# Patient Record
Sex: Female | Born: 1970 | Race: White | Hispanic: No | Smoking: Former smoker
Health system: Southern US, Community
[De-identification: ages and names within clinical notes are randomized; demographics above are authoritative.]

## PROBLEM LIST (undated history)

## (undated) DIAGNOSIS — Z8659 Personal history of other mental and behavioral disorders: Secondary | ICD-10-CM

## (undated) DIAGNOSIS — D649 Anemia, unspecified: Secondary | ICD-10-CM

## (undated) DIAGNOSIS — F329 Major depressive disorder, single episode, unspecified: Secondary | ICD-10-CM

## (undated) DIAGNOSIS — F419 Anxiety disorder, unspecified: Secondary | ICD-10-CM

## (undated) DIAGNOSIS — R011 Cardiac murmur, unspecified: Secondary | ICD-10-CM

## (undated) DIAGNOSIS — F32A Depression, unspecified: Secondary | ICD-10-CM

## (undated) DIAGNOSIS — N912 Amenorrhea, unspecified: Secondary | ICD-10-CM

## (undated) DIAGNOSIS — K900 Celiac disease: Secondary | ICD-10-CM

## (undated) DIAGNOSIS — R32 Unspecified urinary incontinence: Secondary | ICD-10-CM

## (undated) HISTORY — DX: Amenorrhea, unspecified: N91.2

## (undated) HISTORY — DX: Personal history of other mental and behavioral disorders: Z86.59

## (undated) HISTORY — DX: Depression, unspecified: F32.A

## (undated) HISTORY — DX: Anemia, unspecified: D64.9

## (undated) HISTORY — PX: OVARIAN CYST DRAINAGE: SHX325

## (undated) HISTORY — DX: Celiac disease: K90.0

## (undated) HISTORY — DX: Cardiac murmur, unspecified: R01.1

## (undated) HISTORY — DX: Anxiety disorder, unspecified: F41.9

## (undated) HISTORY — DX: Major depressive disorder, single episode, unspecified: F32.9

## (undated) HISTORY — DX: Unspecified urinary incontinence: R32

## (undated) HISTORY — PX: COLONOSCOPY: SHX174

---

## 2006-07-13 DIAGNOSIS — K900 Celiac disease: Secondary | ICD-10-CM | POA: Insufficient documentation

## 2014-12-13 DIAGNOSIS — Z8659 Personal history of other mental and behavioral disorders: Secondary | ICD-10-CM | POA: Insufficient documentation

## 2014-12-13 DIAGNOSIS — Z8739 Personal history of other diseases of the musculoskeletal system and connective tissue: Secondary | ICD-10-CM | POA: Insufficient documentation

## 2015-10-31 ENCOUNTER — Encounter: Payer: Self-pay | Admitting: Obstetrics and Gynecology

## 2015-11-16 ENCOUNTER — Encounter: Payer: Self-pay | Admitting: Obstetrics and Gynecology

## 2015-11-16 ENCOUNTER — Ambulatory Visit: Payer: Self-pay | Admitting: Obstetrics and Gynecology

## 2015-11-16 VITALS — BP 124/72 | HR 60 | Resp 13 | Ht 63.25 in | Wt 117.0 lb

## 2015-11-16 DIAGNOSIS — Z8742 Personal history of other diseases of the female genital tract: Secondary | ICD-10-CM

## 2015-11-16 DIAGNOSIS — R1031 Right lower quadrant pain: Secondary | ICD-10-CM | POA: Diagnosis not present

## 2015-11-16 DIAGNOSIS — Z124 Encounter for screening for malignant neoplasm of cervix: Secondary | ICD-10-CM

## 2015-11-16 DIAGNOSIS — Z30431 Encounter for routine checking of intrauterine contraceptive device: Secondary | ICD-10-CM | POA: Diagnosis not present

## 2015-11-16 DIAGNOSIS — Z3009 Encounter for other general counseling and advice on contraception: Secondary | ICD-10-CM | POA: Diagnosis not present

## 2015-11-16 DIAGNOSIS — Z01419 Encounter for gynecological examination (general) (routine) without abnormal findings: Secondary | ICD-10-CM

## 2015-11-16 DIAGNOSIS — R102 Pelvic and perineal pain: Secondary | ICD-10-CM | POA: Diagnosis not present

## 2015-11-16 NOTE — Progress Notes (Addendum)
45 y.o. Michelle Ward MarriedCaucasianF here for annual exam.  She has a Mirena placed 6/12. Occasional spotting with the IUD, no cramping. Sexually active, no pain. She has some GSI, leaks when she runs. She has to change to poise pad during her runs. She is also starting to do triathlons and it is a problem during the running (and can't use a pad). It is interfering with her life.  She has had issues with a right ovarian cyst, she had laparoscopic drainage when she was pregnant 5 years ago. She never had a f/u ultrasound. States the cyst was 9 cm. She is still getting random pain in the RLQ, can occur a couple of times a week, then nothing for a few weeks. Doesn't last more than 5 minutes. Not worsening.     No LMP recorded. Patient is not currently having periods (Reason: IUD).          Sexually active: Yes.    The current method of family planning is IUD.    Exercising: Yes.    running,cardio, weights and yoga Smoker:  no  Health Maintenance: Pap:  unsure History of abnormal Pap:  no MMG:  07/2015 WNL per patient  Colonoscopy:  Never BMD:   02/2015 WNL per patient  TDaP:  Unsure, she will check with her primary Gardasil: N/A   reports that she has never smoked. She has never used smokeless tobacco. She reports that she drinks about 6.0 oz of alcohol per week . She reports that she does not use drugs. Stay at home Mom. Kids are 8 and 5.   Past Medical History:  Diagnosis Date  . Amenorrhea   . Anemia   . Anxiety   . Celiac disease   . Depression   . Heart murmur   . Urinary incontinence   History of anorexia  Past Surgical History:  Procedure Laterality Date  . OVARIAN CYST DRAINAGE Right     Current Outpatient Prescriptions  Medication Sig Dispense Refill  . buPROPion (WELLBUTRIN SR) 150 MG 12 hr tablet TAKE ONE TABLET (150 MG TOTAL) BY MOUTH 2 (TWO) TIMES DAILY.    Marland Kitchen escitalopram (LEXAPRO) 20 MG tablet TAKE 1 TABLET BY MOUTH EVERY DAY    . levonorgestrel (MIRENA) 20 MCG/24HR  IUD by Intrauterine route.     No current facility-administered medications for this visit.     Family History  Problem Relation Age of Onset  . Breast cancer Mother   . Diabetes Father   . Hypertension Father   . Dementia Father   . Stroke Maternal Grandfather   . Lymphoma Paternal Grandmother   . Heart attack Paternal Grandfather   Mom with breast cancer at 22, negative genetic testing.   Review of Systems  Constitutional: Negative.   HENT: Negative.   Eyes: Negative.   Respiratory: Negative.   Cardiovascular: Negative.   Gastrointestinal: Negative.   Endocrine: Negative.   Genitourinary: Positive for pelvic pain.       Urinary leakage with running/ coughing Right side pelvic pain  Musculoskeletal: Negative.   Skin: Negative.   Allergic/Immunologic: Negative.   Neurological: Negative.   Psychiatric/Behavioral: Negative.     Exam:   BP 124/72 (BP Location: Right Arm, Patient Position: Sitting, Cuff Size: Normal)   Pulse 60   Resp 13   Ht 5' 3.25" (1.607 m)   Wt 117 lb (53.1 kg)   BMI 20.56 kg/m   Weight change: @WEIGHTCHANGE @ Height:   Height: 5' 3.25" (160.7 cm)  Ht Readings  from Last 3 Encounters:  11/16/15 5' 3.25" (1.607 m)    General appearance: alert, cooperative and appears stated age Head: Normocephalic, without obvious abnormality, atraumatic Neck: no adenopathy, supple, symmetrical, trachea midline and thyroid normal to inspection and palpation  CV: RRR, she does have a grade 2-3/6 SEM, loudest at the LSB Lungs: clear to auscultation bilaterally Breasts: normal appearance, no masses or tenderness, bilateral fibrocystic changes Heart: regular rate and rhythm Abdomen: soft, non-tender; bowel sounds normal; no masses,  no organomegaly Extremities: extremities normal, atraumatic, no cyanosis or edema Skin: Skin color, texture, turgor normal. No rashes or lesions Lymph nodes: Cervical, supraclavicular, and axillary nodes normal. No abnormal inguinal  nodes palpated Neurologic: Grossly normal   Pelvic: External genitalia:  no lesions              Urethra:  normal appearing urethra with no masses, tenderness or lesions              Bartholins and Skenes: normal                 Vagina: normal appearing vagina with normal color and discharge, no lesions, no significant prolapse              Cervix: no lesions and IUD string not seen               Bimanual Exam:  Uterus:  normal size, contour, position, consistency, mobility, non-tender              Adnexa: no mass, fullness, tenderness               Rectovaginal: Confirms               Anus:  normal sphincter tone, no lesions  Chaperone was present for exam.  A:  Well Woman with normal exam  GSI, discussed PT and surgery  Mirena, due for a new IUD  RLQ abdominal/pelvic pain  H/O a right ovarian cyst, no f/u  Heart murmur, she reports recent w/u, benign  P:   Pap with hpv  Mammogram utd  Return for a gyn ultrasound  Return for Mirena removal and reinsertion (u/s guidance for removal)  Consult with Dr Edward JollySilva for a TVT  Discussed breast self exam  Discussed calcium and vit D intake   CC: Dr Edward JollySilva  Addendum: mammogram from [facility]14/15 was reviewed. Negative, breast density is category D. They calculate her lifetime risk of breast cancer as >20%. Dr Edward JollySilva reviewed with her the option of adding MRI's.

## 2015-11-16 NOTE — Patient Instructions (Signed)
EXERCISE AND DIET:  We recommended that you start or continue a regular exercise program for good health. Regular exercise means any activity that makes your heart beat faster and makes you sweat.  We recommend exercising at least 30 minutes per day at least 3 days a week, preferably 4 or 5.  We also recommend a diet low in fat and sugar.  Inactivity, poor dietary choices and obesity can cause diabetes, heart attack, stroke, and kidney damage, among others.    ALCOHOL AND SMOKING:  Women should limit their alcohol intake to no more than 7 drinks/beers/glasses of wine (combined, not each!) per week. Moderation of alcohol intake to this level decreases your risk of breast cancer and liver damage. And of course, no recreational drugs are part of a healthy lifestyle.  And absolutely no smoking or even second hand smoke. Most people know smoking can cause heart and lung diseases, but did you know it also contributes to weakening of your bones? Aging of your skin?  Yellowing of your teeth and nails?  CALCIUM AND VITAMIN D:  Adequate intake of calcium and Vitamin D are recommended.  The recommendations for exact amounts of these supplements seem to change often, but generally speaking 600 mg of calcium (either carbonate or citrate) and 800 units of Vitamin D per day seems prudent. Certain women may benefit from higher intake of Vitamin D.  If you are among these women, your doctor will have told you during your visit.    PAP SMEARS:  Pap smears, to check for cervical cancer or precancers,  have traditionally been done yearly, although recent scientific advances have shown that most women can have pap smears less often.  However, every woman still should have a physical exam from her gynecologist every year. It will include a breast check, inspection of the vulva and vagina to check for abnormal growths or skin changes, a visual exam of the cervix, and then an exam to evaluate the size and shape of the uterus and  ovaries.  And after 44 years of age, a rectal exam is indicated to check for rectal cancers. We will also provide age appropriate advice regarding health maintenance, like when you should have certain vaccines, screening for sexually transmitted diseases, bone density testing, colonoscopy, mammograms, etc.   MAMMOGRAMS:  All women over 40 years old should have a yearly mammogram. Many facilities now offer a "3D" mammogram, which may cost around $50 extra out of pocket. If possible,  we recommend you accept the option to have the 3D mammogram performed.  It both reduces the number of women who will be called back for extra views which then turn out to be normal, and it is better than the routine mammogram at detecting truly abnormal areas.    COLONOSCOPY:  Colonoscopy to screen for colon cancer is recommended for all women at age 50.  We know, you hate the idea of the prep.  We agree, BUT, having colon cancer and not knowing it is worse!!  Colon cancer so often starts as a polyp that can be seen and removed at colonscopy, which can quite literally save your life!  And if your first colonoscopy is normal and you have no family history of colon cancer, most women don't have to have it again for 10 years.  Once every ten years, you can do something that may end up saving your life, right?  We will be happy to help you get it scheduled when you are ready.    Be sure to check your insurance coverage so you understand how much it will cost.  It may be covered as a preventative service at no cost, but you should check your particular policy.      Breast Self-Awareness Practicing breast self-awareness may pick up problems early, prevent significant medical complications, and possibly save your life. By practicing breast self-awareness, you can become familiar with how your breasts look and feel and if your breasts are changing. This allows you to notice changes early. It can also offer you some reassurance that your  breast health is good. One way to learn what is normal for your breasts and whether your breasts are changing is to do a breast self-exam. If you find a lump or something that was not present in the past, it is best to contact your caregiver right away. Other findings that should be evaluated by your caregiver include nipple discharge, especially if it is bloody; skin changes or reddening; areas where the skin seems to be pulled in (retracted); or new lumps and bumps. Breast pain is seldom associated with cancer (malignancy), but should also be evaluated by a caregiver. HOW TO PERFORM A BREAST SELF-EXAM The best time to examine your breasts is 5-7 days after your menstrual period is over. During menstruation, the breasts are lumpier, and it may be more difficult to pick up changes. If you do not menstruate, have reached menopause, or had your uterus removed (hysterectomy), you should examine your breasts at regular intervals, such as monthly. If you are breastfeeding, examine your breasts after a feeding or after using a breast pump. Breast implants do not decrease the risk for lumps or tumors, so continue to perform breast self-exams as recommended. Talk to your caregiver about how to determine the difference between the implant and breast tissue. Also, talk about the amount of pressure you should use during the exam. Over time, you will become more familiar with the variations of your breasts and more comfortable with the exam. A breast self-exam requires you to remove all your clothes above the waist. 1. Look at your breasts and nipples. Stand in front of a mirror in a room with good lighting. With your hands on your hips, push your hands firmly downward. Look for a difference in shape, contour, and size from one breast to the other (asymmetry). Asymmetry includes puckers, dips, or bumps. Also, look for skin changes, such as reddened or scaly areas on the breasts. Look for nipple changes, such as discharge,  dimpling, repositioning, or redness. 2. Carefully feel your breasts. This is best done either in the shower or tub while using soapy water or when flat on your back. Place the arm (on the side of the breast you are examining) above your head. Use the pads (not the fingertips) of your three middle fingers on your opposite hand to feel your breasts. Start in the underarm area and use  inch (2 cm) overlapping circles to feel your breast. Use 3 different levels of pressure (light, medium, and firm pressure) at each circle before moving to the next circle. The light pressure is needed to feel the tissue closest to the skin. The medium pressure will help to feel breast tissue a little deeper, while the firm pressure is needed to feel the tissue close to the ribs. Continue the overlapping circles, moving downward over the breast until you feel your ribs below your breast. Then, move one finger-width towards the center of the body. Continue to use the    inch (2 cm) overlapping circles to feel your breast as you move slowly up toward the collar bone (clavicle) near the base of the neck. Continue the up and down exam using all 3 pressures until you reach the middle of the chest. Do this with each breast, carefully feeling for lumps or changes. 3.  Keep a written record with breast changes or normal findings for each breast. By writing this information down, you do not need to depend only on memory for size, tenderness, or location. Write down where you are in your menstrual cycle, if you are still menstruating. Breast tissue can have some lumps or thick tissue. However, see your caregiver if you find anything that concerns you.  SEEK MEDICAL CARE IF:  You see a change in shape, contour, or size of your breasts or nipples.   You see skin changes, such as reddened or scaly areas on the breasts or nipples.   You have an unusual discharge from your nipples.   You feel a new lump or unusually thick areas.     This information is not intended to replace advice given to you by your health care provider. Make sure you discuss any questions you have with your health care provider.   Document Released: 01/28/2005 Document Revised: 01/15/2012 Document Reviewed: 05/15/2011 Elsevier Interactive Patient Education 2016 [employer]  

## 2015-11-17 LAB — IPS PAP TEST WITH HPV

## 2015-11-20 ENCOUNTER — Encounter: Payer: Self-pay | Admitting: Obstetrics and Gynecology

## 2015-11-20 ENCOUNTER — Ambulatory Visit (INDEPENDENT_AMBULATORY_CARE_PROVIDER_SITE_OTHER): Payer: Managed Care, Other (non HMO) | Admitting: Obstetrics and Gynecology

## 2015-11-20 VITALS — BP 120/70 | HR 74 | Resp 16 | Ht 63.25 in | Wt 117.0 lb

## 2015-11-20 DIAGNOSIS — R32 Unspecified urinary incontinence: Secondary | ICD-10-CM

## 2015-11-20 DIAGNOSIS — N393 Stress incontinence (female) (male): Secondary | ICD-10-CM | POA: Diagnosis not present

## 2015-11-20 DIAGNOSIS — N8110 Cystocele, unspecified: Secondary | ICD-10-CM | POA: Diagnosis not present

## 2015-11-20 LAB — POCT URINALYSIS DIPSTICK
Bilirubin, UA: NEGATIVE
Glucose, UA: NEGATIVE
KETONES UA: NEGATIVE
Leukocytes, UA: NEGATIVE
Nitrite, UA: NEGATIVE
PH UA: 5
PROTEIN UA: NEGATIVE
Urobilinogen, UA: NEGATIVE

## 2015-11-20 NOTE — Progress Notes (Signed)
GYNECOLOGY  VISIT   HPI: 45 y.o.   Married  Caucasian  female   G2P2002 with No LMP recorded. Patient is not currently having periods (Reason: IUD).   here for  Urinary incontinence.  Referred by her gynecologist, Dr. Oscar LaJertson.  Bladder problems began after child bearing.  Leaks with sneezing, coughing, exercising.  Is a big runner, and is leaking with her runs.  Wearing pads.  Impressa was uncomfortable.   No leak for no reason.  DF - can hold it for a while.  NF - none.  No enuresis.   No hematuria.  No hx stones.  No UTIs.   No constipation or loss of control of stool.  Has celiac disease.   Had vaginal pain during her menses prior to IUD placement.  Now does not have this.   No pelvic pressure or prolapse noted by patient.  No problems with intercourse.   Status post laparoscopic drainage of an ovarian cyst during pregnancy at [redacted] weeks gestation. She ended up having a vaginal delivery with induction due to ongoing pain after the cystectomy.  Other delivery was vaginal, 7 pounds 6 ounces.   poct urine- RBC trace, having spotting  GYNECOLOGIC HISTORY: No LMP recorded. Patient is not currently having periods (Reason: IUD). Contraception:  Mirena IUD Menopausal hormone therapy:  n/a Last mammogram:  6/17 normal per patient Last pap smear:  11/16/15 neg HPV HR neg   OB History    Gravida Para Term Preterm AB Living   2 2 2     2    SAB TAB Ectopic Multiple Live Births           2         There are no active problems to display for this patient.   Past Medical History:  Diagnosis Date  . Amenorrhea   . Anemia   . Anxiety   . Celiac disease   . Depression   . Heart murmur   . History of anorexia nervosa   . Urinary incontinence     Past Surgical History:  Procedure Laterality Date  . OVARIAN CYST DRAINAGE Right     Current Outpatient Prescriptions  Medication Sig Dispense Refill  . buPROPion (WELLBUTRIN SR) 150 MG 12 hr tablet TAKE ONE TABLET (150  MG TOTAL) BY MOUTH 2 (TWO) TIMES DAILY.    Marland Kitchen. escitalopram (LEXAPRO) 20 MG tablet TAKE 1 TABLET BY MOUTH EVERY DAY    . levonorgestrel (MIRENA) 20 MCG/24HR IUD by Intrauterine route.     No current facility-administered medications for this visit.      ALLERGIES: Review of patient's allergies indicates no known allergies.  Family History  Problem Relation Age of Onset  . Breast cancer Mother   . Diabetes Father   . Hypertension Father   . Dementia Father   . Stroke Maternal Grandfather   . Lymphoma Paternal Grandmother   . Heart attack Paternal Grandfather     Social History   Social History  . Marital status: Married    Spouse name: N/A  . Number of children: N/A  . Years of education: N/A   Occupational History  . Not on file.   Social History Main Topics  . Smoking status: Former Games developermoker  . Smokeless tobacco: Never Used  . Alcohol use 6.0 oz/week    10 Glasses of wine per week  . Drug use: No  . Sexual activity: Yes    Partners: Male    Birth control/ protection: IUD  Other Topics Concern  . Not on file   Social History Narrative  . No narrative on file    ROS:  Pertinent items are noted in HPI.  PHYSICAL EXAMINATION:    BP 120/70   Pulse 74   Resp 16   Ht 5' 3.25" (1.607 m)   Wt 117 lb (53.1 kg)   BMI 20.56 kg/m     General appearance: alert, cooperative and appears stated age   Abdomen: soft, non-tender, no masses,  no organomegaly  Pelvic: External genitalia:  no lesions              Urethra:  normal appearing urethra with no masses, tenderness or lesions              Bartholins and Skenes: normal                 Vagina: normal appearing vagina with normal color and discharge, no lesions.  Almost first degree cystocele.  No significant uterine prolapse, minimal rectocele with valsalva.              Cervix: no lesions.  No IUD strings seen.                Bimanual Exam:  Uterus:  normal size, contour, position, consistency, mobility,  non-tender.                Adnexa: no mass, fullness, tenderness              Rectal exam: Yes.  .  Confirms.              Anus:  normal sphincter tone, no lesions  Chaperone was present for exam.  ASSESSMENT  Genuine stress incontinence.  First degree cystocele.  Avid exerciser.  PLAN  We discussed genuine stress incontinence/pelvic organ prolapse (cystocele) and risk factors.  We discussed treatment options - observation, pessary, PT, and midurethral slings/cystoscopy/anterior colporrhaphy.  We discussed permanent mesh materials which are currently the most widely used for this procedure.  I discussed slings being 85 - 90% effective in treatment of stress incontinence.  I discussed specific risks of slings including but not limited to erosions and exposure, dyspareunia, cystotomy, urinary retention and slower voiding, increase in urgency symptoms, need for prolonged catheterization or self catheterization, and urinary tract infection. Patient has straight forward stress incontinence, so she does not require multichannel urodynamics. I am recommending simple cystometrics with a post void residual and stress test after bladder filling.  Procedure explained.   Patient wishes to proceed forward in her care.  She plans to start training for the [facility] the beginning of Jan. 2018. I recommend 8 weeks of not lifting over 10 pounds and no training/running during this time.    An After Visit Summary was printed and given to the patient.  __25____ minutes face to face time of which over 50% was spent in counseling.

## 2015-11-21 ENCOUNTER — Encounter: Payer: Self-pay | Admitting: Obstetrics and Gynecology

## 2015-11-28 ENCOUNTER — Other Ambulatory Visit: Payer: Self-pay | Admitting: Obstetrics and Gynecology

## 2015-11-28 ENCOUNTER — Ambulatory Visit (INDEPENDENT_AMBULATORY_CARE_PROVIDER_SITE_OTHER): Payer: Managed Care, Other (non HMO) | Admitting: Obstetrics and Gynecology

## 2015-11-28 ENCOUNTER — Ambulatory Visit (INDEPENDENT_AMBULATORY_CARE_PROVIDER_SITE_OTHER): Payer: Managed Care, Other (non HMO)

## 2015-11-28 ENCOUNTER — Encounter: Payer: Self-pay | Admitting: Obstetrics and Gynecology

## 2015-11-28 VITALS — BP 146/80 | HR 58 | Ht 63.25 in | Wt 122.0 lb

## 2015-11-28 DIAGNOSIS — Z8742 Personal history of other diseases of the female genital tract: Secondary | ICD-10-CM | POA: Diagnosis not present

## 2015-11-28 DIAGNOSIS — Z30431 Encounter for routine checking of intrauterine contraceptive device: Secondary | ICD-10-CM

## 2015-11-28 DIAGNOSIS — Z30433 Encounter for removal and reinsertion of intrauterine contraceptive device: Secondary | ICD-10-CM | POA: Diagnosis not present

## 2015-11-28 DIAGNOSIS — R102 Pelvic and perineal pain: Secondary | ICD-10-CM | POA: Diagnosis not present

## 2015-11-28 DIAGNOSIS — R1031 Right lower quadrant pain: Secondary | ICD-10-CM | POA: Diagnosis not present

## 2015-11-28 DIAGNOSIS — Z3009 Encounter for other general counseling and advice on contraception: Secondary | ICD-10-CM

## 2015-11-28 NOTE — Progress Notes (Signed)
GYNECOLOGY  VISIT   HPI: 45 y.o.   Married  Caucasian  female   G2P2002 with No LMP recorded. Patient is not currently having periods (Reason: IUD).   here for Mirena IUD removal and reinsertion under ultrasound guidance. Her IUD strings were not seen at her exam. On ultrasound the strings are seen in the uterine cavity.     GYNECOLOGIC HISTORY: No LMP recorded. Patient is not currently having periods (Reason: IUD). Contraception:Mirena IUD Menopausal hormone therapy: NA        OB History    Gravida Para Term Preterm AB Living   2 2 2     2    SAB TAB Ectopic Multiple Live Births           2         There are no active problems to display for this patient.   Past Medical History:  Diagnosis Date  . Amenorrhea   . Anemia   . Anxiety   . Celiac disease   . Depression   . Heart murmur   . History of anorexia nervosa   . Urinary incontinence     Past Surgical History:  Procedure Laterality Date  . OVARIAN CYST DRAINAGE Right     Current Outpatient Prescriptions  Medication Sig Dispense Refill  . buPROPion (WELLBUTRIN SR) 150 MG 12 hr tablet TAKE ONE TABLET (150 MG TOTAL) BY MOUTH 2 (TWO) TIMES DAILY.    Marland Kitchen. escitalopram (LEXAPRO) 20 MG tablet TAKE 1 TABLET BY MOUTH EVERY DAY    . levonorgestrel (MIRENA) 20 MCG/24HR IUD by Intrauterine route.     No current facility-administered medications for this visit.      ALLERGIES: Review of patient's allergies indicates no known allergies.  Family History  Problem Relation Age of Onset  . Breast cancer Mother   . Diabetes Father   . Hypertension Father   . Dementia Father   . Stroke Maternal Grandfather   . Lymphoma Paternal Grandmother   . Heart attack Paternal Grandfather     Social History   Social History  . Marital status: Married    Spouse name: N/A  . Number of children: N/A  . Years of education: N/A   Occupational History  . Not on file.   Social History Main Topics  . Smoking status: Former Games developermoker   . Smokeless tobacco: Never Used  . Alcohol use 6.0 oz/week    10 Glasses of wine per week  . Drug use: No  . Sexual activity: Yes    Partners: Male    Birth control/ protection: IUD   Other Topics Concern  . Not on file   Social History Narrative  . No narrative on file    Review of Systems  Unable to perform ROS: Other    PHYSICAL EXAMINATION:    There were no vitals taken for this visit.    General appearance: alert, cooperative and appears stated age  Pelvic: External genitalia:  no lesions              Urethra:  normal appearing urethra with no masses, tenderness or lesions              Bartholins and Skenes: normal                 Vagina: normal appearing vagina with normal color and discharge, no lesions              Cervix: no lesions  The risks of the mirena IUD were reviewed with the patient, including infection, abnormal bleeding and uterine perfortion. Consent was signed.  A speculum was placed in the vagina, the cervix was cleansed with betadine. A tenaculum was placed on the cervix, the cervix was dilated to a #5 hagar dilator. With ultrasound guidance the IUD was removed with an IUD hook. Several attempts were made.  The new mirena IUD was then inserted without difficulty. The uterus sounded to approximately 8 cm. The string were cut to 3-4 cm. The tenaculum was removed. Slight oozing from the tenaculum site was stopped with pressure.   The IUD was inserted under ultrasound guidance and was noted to be in place after insertion.   The patient tolerated the procedure well.   Chaperone was present for exam.  ASSESSMENT Removal and reinsertion of the IUD under ultrasound guidance.     PLAN She is planning TVT in the next month. Will check her IUD strings then.   An After Visit Summary was printed and given to the patient.

## 2015-11-28 NOTE — Patient Instructions (Signed)

## 2015-11-30 ENCOUNTER — Encounter: Payer: Self-pay | Admitting: Obstetrics and Gynecology

## 2015-11-30 ENCOUNTER — Ambulatory Visit (INDEPENDENT_AMBULATORY_CARE_PROVIDER_SITE_OTHER): Payer: Managed Care, Other (non HMO) | Admitting: Obstetrics and Gynecology

## 2015-11-30 DIAGNOSIS — N393 Stress incontinence (female) (male): Secondary | ICD-10-CM | POA: Diagnosis not present

## 2015-11-30 NOTE — Progress Notes (Signed)
GYNECOLOGY  VISIT   HPI: 45 y.o.   Married  Caucasian  female   G2P2002 with Patient's last menstrual period was 08/16/2015 (exact date).   here for cystometrics.    Leaks urine with exercise.   Just had IUD placed.   Father developed dementia after having general anesthesia with surgery.  GYNECOLOGIC HISTORY: Patient's last menstrual period was 08/16/2015 (exact date). Contraception:  Mirena IUD inserted 11-28-15 Menopausal hormone therapy:  n/a Last mammogram:  07/2015 normal per patient:[facility].  Cat D density.  BI-RADS-1.  Lifetime risk of breast CA is over 20% so I discussed MRI next year if desires. Last pap smear:   11-16-15 Neg:Neg HR HPV        OB History    Gravida Para Term Preterm AB Living   2 2 2     2    SAB TAB Ectopic Multiple Live Births           2         There are no active problems to display for this patient.   Past Medical History:  Diagnosis Date  . Amenorrhea   . Anemia   . Anxiety   . Celiac disease   . Depression   . Heart murmur   . History of anorexia nervosa   . Urinary incontinence     Past Surgical History:  Procedure Laterality Date  . OVARIAN CYST DRAINAGE Right     Current Outpatient Prescriptions  Medication Sig Dispense Refill  . buPROPion (WELLBUTRIN SR) 150 MG 12 hr tablet TAKE ONE TABLET (150 MG TOTAL) BY MOUTH 2 (TWO) TIMES DAILY.    Marland Kitchen escitalopram (LEXAPRO) 20 MG tablet TAKE 1 TABLET BY MOUTH EVERY DAY    . levonorgestrel (MIRENA) 20 MCG/24HR IUD by Intrauterine route.     No current facility-administered medications for this visit.      ALLERGIES: Review of patient's allergies indicates no known allergies.  Family History  Problem Relation Age of Onset  . Breast cancer Mother   . Diabetes Father   . Hypertension Father   . Dementia Father   . Stroke Maternal Grandfather   . Lymphoma Paternal Grandmother   . Heart attack Paternal Grandfather     Social History   Social History  . Marital status: Married     Spouse name: N/A  . Number of children: N/A  . Years of education: N/A   Occupational History  . Not on file.   Social History Main Topics  . Smoking status: Former Games developer  . Smokeless tobacco: Never Used  . Alcohol use 6.0 oz/week    10 Glasses of wine per week  . Drug use: No  . Sexual activity: Yes    Partners: Male    Birth control/ protection: IUD     Comment: Mirena inserted 11-28-15   Other Topics Concern  . Not on file   Social History Narrative  . No narrative on file    ROS:  Pertinent items are noted in HPI.  PHYSICAL EXAMINATION:    BP 108/60 (BP Location: Right Arm, Patient Position: Sitting, Cuff Size: Normal)   Pulse (!) 50   Ht 5' 3.25" (1.607 m)   Wt 119 lb (54 kg)   LMP 08/16/2015 (Exact Date)   BMI 20.91 kg/m     General appearance: alert, cooperative and appears stated age   Pelvic: External genitalia:  no lesions              Urethra:  normal appearing urethra with no masses, tenderness or lesions              Bartholins and Skenes: normal                 Vagina: normal appearing vagina with normal color and discharge, no lesions              Cervix: no lesions.  IUD stings seen.  Simple cystometrics. Verbal consent for procedure.  Void - 200 cc.  Sterile catheterization with red rubber cath using lidocaine jelly 1% and betadine for prep. PVR - 10 cc.  FIlling - S1 115 cc, S2 220 cc, S3, 250 cc using sterile saline fluid. Max capacity - 300 cc.  No detrusor contractions.  Evidence of leak with valsalva at 300 cc after catheter removed. No complications of cystometrics. Tolerated well.   Chaperone was present for exam.  ASSESSMENT  GSI.  Cystocele. Mirena IUD patient.   PLAN  I discussed options of medical therapy - PT, Impressa versus surgical correction. Patient interested in surgery so will precert and schedule TVT Exact midurethral sling and cystoscopy, anterior colporrhaphy.  We discussed permanent mesh materials which are  currently the most widely used for this procedure.  I discussed slings being 85 - 90% effective in treatment of stress incontinence.  I discussed risks of surgery including but not limited to erosions and exposure, cystotomy, urinary retention and slower voiding, increase in urgency symptoms, need for prolonged catheterization or self catheterization, urinary tract infections, bleeding, infection, damage to surrounding organs, reaction to anesthesia, DVT, PE, death, need for reoperation, and recurrence of incontinence.  Patient wishes to proceed.    An After Visit Summary was printed and given to the patient.  __25___ minutes face to face time of which over 50% was spent in counseling.

## 2015-12-06 NOTE — Patient Instructions (Signed)
Your procedure is scheduled on:  Monday, Nov. 6, 2017  [facility]:  6:00 AM  Pick up the phone at the desk and dial [phone].  Call this number if you have problems the morning of surgery: [phone].  Remember: Do NOT eat food or drink after:  Midnight Sunday, Nov. 5, 2017  Take these medicines the morning of surgery with a SIP OF WATER:  Wellbutrin, Lexapro  Stop ALL herbal medications at this time   Do NOT wear jewelry (body piercing), metal hair clips/bobby pins, make-up, or nail polish. Do NOT wear lotions, powders, or perfumes.  You may wear deodorant. Do NOT shave for 48 hours prior to surgery. Do NOT bring valuables to the hospital. Contacts, dentures, or bridgework may not be worn into surgery.  Leave suitcase in car.  After surgery it may be brought to your room.  For patients admitted to the hospital, checkout time is 11:00 AM the day of discharge.

## 2015-12-07 ENCOUNTER — Encounter (HOSPITAL_COMMUNITY): Payer: Self-pay

## 2015-12-07 ENCOUNTER — Encounter (HOSPITAL_COMMUNITY)
Admission: RE | Admit: 2015-12-07 | Discharge: 2015-12-07 | Disposition: A | Discharge status: Home / Self Care | Payer: Managed Care, Other (non HMO) | Source: Ambulatory Visit | Attending: Obstetrics and Gynecology | Admitting: Obstetrics and Gynecology

## 2015-12-07 ENCOUNTER — Encounter: Payer: Self-pay | Admitting: Obstetrics and Gynecology

## 2015-12-07 ENCOUNTER — Ambulatory Visit (INDEPENDENT_AMBULATORY_CARE_PROVIDER_SITE_OTHER): Payer: Managed Care, Other (non HMO) | Admitting: Obstetrics and Gynecology

## 2015-12-07 ENCOUNTER — Telehealth: Payer: Self-pay | Admitting: Obstetrics and Gynecology

## 2015-12-07 VITALS — BP 120/76 | HR 56 | Ht 63.25 in

## 2015-12-07 DIAGNOSIS — Z01818 Encounter for other preprocedural examination: Secondary | ICD-10-CM | POA: Diagnosis not present

## 2015-12-07 DIAGNOSIS — N8110 Cystocele, unspecified: Secondary | ICD-10-CM

## 2015-12-07 DIAGNOSIS — N393 Stress incontinence (female) (male): Secondary | ICD-10-CM

## 2015-12-07 LAB — CBC
HCT: 36.5 % (ref 36.0–46.0)
HEMOGLOBIN: 12.9 g/dL (ref 12.0–15.0)
MCH: 31.9 pg (ref 26.0–34.0)
MCHC: 35.3 g/dL (ref 30.0–36.0)
MCV: 90.3 fL (ref 78.0–100.0)
Platelets: 284 10*3/uL (ref 150–400)
RBC: 4.04 MIL/uL (ref 3.87–5.11)
RDW: 13.9 % (ref 11.5–15.5)
WBC: 8.1 10*3/uL (ref 4.0–10.5)

## 2015-12-07 LAB — BASIC METABOLIC PANEL
ANION GAP: 9 (ref 5–15)
BUN: 23 mg/dL — ABNORMAL HIGH (ref 6–20)
CALCIUM: 9.5 mg/dL (ref 8.9–10.3)
CO2: 25 mmol/L (ref 22–32)
Chloride: 104 mmol/L (ref 101–111)
Creatinine, Ser: 0.75 mg/dL (ref 0.44–1.00)
Glucose, Bld: 103 mg/dL — ABNORMAL HIGH (ref 65–99)
Potassium: 4.3 mmol/L (ref 3.5–5.1)
Sodium: 138 mmol/L (ref 135–145)

## 2015-12-07 MED ORDER — IBUPROFEN 800 MG PO TABS: 800.0000 mg | ORAL_TABLET | Freq: Three times a day (TID) | ORAL | 1 refills | Status: DC | PRN

## 2015-12-07 MED ORDER — OXYCODONE-ACETAMINOPHEN 5-325 MG PO TABS: 2.0000 | ORAL_TABLET | ORAL | 0 refills | Status: DC | PRN

## 2015-12-07 NOTE — Progress Notes (Signed)
GYNECOLOGY  VISIT   HPI: 45 y.o.   Married  Caucasian  female   G2P2002 with Patient's last menstrual period was 08/16/2015 (exact date).   here for surgical consult.   Desires surgery for urinary incontinence which occurs with exercise.  Husband is present today for the entire visit.    Simple office cystometrics 11/30/15: Void - 200 cc.  PVR - 10 cc.  FIlling - S1 115 cc, S2 220 cc, S3, 250 cc. Max capacity - 300 cc.  No detrusor contractions.  Evidence of leak with valsalva at 300 cc after catheter removed.  Office examination showing almost first degree cystocele.   Planning to do the [place] marathon in the spring 2018.  GYNECOLOGIC HISTORY: Patient's last menstrual period was 08/16/2015 (exact date). Contraception:  Mirena IUD inserted 11-28-15 Menopausal hormone therapy:  n/a Last mammogram:    07/2015 normal per patient:[facility].  Cat D density.  BI-RADS-1.  Lifetime risk of breast CA is over 20% so I discussed MRI next year if desires. Last pap smear:   11-16-15 Neg:Neg HR HPV         OB History    Gravida Para Term Preterm AB Living   2 2 2     2    SAB TAB Ectopic Multiple Live Births           2         There are no active problems to display for this patient.   Past Medical History:  Diagnosis Date  . Amenorrhea   . Anemia   . Anxiety   . Celiac disease   . Depression   . Heart murmur   . History of anorexia nervosa   . Urinary incontinence     Past Surgical History:  Procedure Laterality Date  . OVARIAN CYST DRAINAGE Right     Current Outpatient Prescriptions  Medication Sig Dispense Refill  . buPROPion (WELLBUTRIN SR) 150 MG 12 hr tablet TAKE ONE TABLET (150 MG TOTAL) BY MOUTH 2 (TWO) TIMES DAILY.    Marland Kitchen escitalopram (LEXAPRO) 20 MG tablet TAKE 1 TABLET BY MOUTH EVERY DAY    . levonorgestrel (MIRENA) 20 MCG/24HR IUD by Intrauterine route.    . Multiple Vitamins-Minerals (MULTIVITAMIN PO) Take 1 tablet by mouth every morning.     No current  facility-administered medications for this visit.      ALLERGIES: Review of patient's allergies indicates no known allergies.  Family History  Problem Relation Age of Onset  . Breast cancer Mother   . Diabetes Father   . Hypertension Father   . Dementia Father   . Stroke Maternal Grandfather   . Lymphoma Paternal Grandmother   . Heart attack Paternal Grandfather     Social History   Social History  . Marital status: Married    Spouse name: N/A  . Number of children: N/A  . Years of education: N/A   Occupational History  . Not on file.   Social History Main Topics  . Smoking status: Former Games developer  . Smokeless tobacco: Never Used  . Alcohol use 6.0 oz/week    10 Glasses of wine per week  . Drug use: No  . Sexual activity: Yes    Partners: Male    Birth control/ protection: IUD     Comment: Mirena inserted 11-28-15   Other Topics Concern  . Not on file   Social History Narrative  . No narrative on file    ROS:  Pertinent items are noted in  HPI.  PHYSICAL EXAMINATION:    BP 120/76 (BP Location: Right Arm, Patient Position: Sitting, Cuff Size: Normal)   Pulse (!) 56   Ht 5' 3.25" (1.607 m)   LMP 08/16/2015 (Exact Date)     General appearance: alert, cooperative and appears stated age Head: Normocephalic, without obvious abnormality, atraumatic Neck: no adenopathy, supple, symmetrical, trachea midline and thyroid normal to inspection and palpation Lungs: clear to auscultation bilaterall  Heart: regular rate and rhythm Abdomen: soft, non-tender, no masses,  no organomegaly  Neurologic: Grossly normal  Chaperone was present for exam.  ASSESSMENT  Genuine stress incontinence.  Cystocele.  PLAN  Proceed with TVT Exact midurethral sling/cystoscopy/anterior colporrhaphy.  Use of permanent mesh reviewed along with potential risks of erosion, exposure, painful intercourse, reoperation.  Risks/benefits/alternatives of surgery discussed with patient who wishes  to proceed.  Surgical expectations and recovery discussed.  She is prepared not to lift over 10 pounds and not do exercise/running for 8 week post op. Patient wishes to proceed.    An After Visit Summary was printed and given to the patient.

## 2015-12-07 NOTE — Telephone Encounter (Signed)
Patient came in for an appointment today with Dr. Edward JollySilva. While checking the patient in, Mardella LaymanLindsey noticed the patient's 1 week post op appointment needs rescheduled since Dr. Edward JollySilva will not be in the office on 12/25/15. Patient is aware our surgery scheduler, Kennon RoundsSally, will contact her to reschedule this appointment.

## 2015-12-08 NOTE — Telephone Encounter (Signed)
Appointment rescheduled to 12-22-15 per Dr Edward JollySilva direction.  Routing to provider for final review. Patient agreeable to disposition. Will close encounter.

## 2015-12-17 ENCOUNTER — Encounter (HOSPITAL_COMMUNITY): Payer: Self-pay | Admitting: Anesthesiology

## 2015-12-17 MED ORDER — CIPROFLOXACIN IN D5W 400 MG/200ML IV SOLN
400.0000 mg | INTRAVENOUS | Status: AC
  Administered 2015-12-18: 400 mg via INTRAVENOUS
  Filled 2015-12-17: qty 200

## 2015-12-17 NOTE — H&P (Signed)
Office Visit   [facility]  Eliceo Gladu E Ardell IsaacsAmundson C Silva, MD  Obstetrics and Gynecology   Genuine stress incontinence, female +1 more  Dx   Surgery consult ; Referred by Devra Doppamieka Howell, MD  Reason for Visit   Additional Documentation   Vitals:   BP 120/76 (BP Location: Right Arm, Patient Position: Sitting, Cuff Size: Normal)   Pulse  56   Ht 5' 3.25" (1.607 m)   LMP 08/16/2015 (Exact Date)      More Vitals   Flowsheets:   Custom Formula Data,   Infectious Disease Screening,   MEWS Score     Encounter Info:   Billing Info,   History,   Allergies,   Detailed Report     All Notes   Progress Notes by Patton SallesBrook E Amundson C Silva, MD at 12/07/2015 11:00 AM   Author: Patton SallesBrook E Amundson C Silva, MD Author Type: Physician Filed: 12/07/2015 8:08 PM  Note Status: Signed Cosign: Cosign Not Required Encounter Date: 12/07/2015 11:00 AM  Editor: Patton SallesBrook E Amundson C Silva, MD (Physician)  Prior Versions: 1. Alphonsa OverallAmanda L Dixon, CMA (Certified Medical Assistant) at 12/07/2015 11:09 AM - Sign at close encounter    GYNECOLOGY  VISIT   HPI: 45 y.o.   Married  Caucasian  female   G2P2002 with Patient's last menstrual period was 08/16/2015 (exact date).   here for surgical consult.   Desires surgery for urinary incontinence which occurs with exercise.  Husband is present today for the entire visit.    Simple office cystometrics 11/30/15: Void - 200 cc.  PVR - 10 cc.  FIlling - S1 115 cc, S2 220 cc, S3, 250 cc. Max capacity - 300 cc.  No detrusor contractions.  Evidence of leak with valsalva at 300 cc after catheter removed.  Office examination showing almost first degree cystocele.   [place]  GYNECOLOGIC HISTORY: Patient's last menstrual period was 08/16/2015 (exact date). Contraception:  Mirena IUD inserted 11-28-15 Menopausal hormone therapy:  n/a Last mammogram:  07/2015 normal per patient:[facility]. Cat D  density. BI-RADS-1. Lifetime risk of breast CA is over 20% so I discussed MRI next year if desires. Last pap smear:  11-16-15 Neg:Neg HR HPV                 OB History    Gravida Para Term Preterm AB Living   2 2 2     2    SAB TAB Ectopic Multiple Live Births           2         There are no active problems to display for this patient.       Past Medical History:  Diagnosis Date  . Amenorrhea   . Anemia   . Anxiety   . Celiac disease   . Depression   . Heart murmur   . History of anorexia nervosa   . Urinary incontinence          Past Surgical History:  Procedure Laterality Date  . OVARIAN CYST DRAINAGE Right           Current Outpatient Prescriptions  Medication Sig Dispense Refill  . buPROPion (WELLBUTRIN SR) 150 MG 12 hr tablet TAKE ONE TABLET (150 MG TOTAL) BY MOUTH 2 (TWO) TIMES DAILY.    Marland Kitchen. escitalopram (LEXAPRO) 20 MG tablet TAKE 1 TABLET BY MOUTH EVERY DAY    . levonorgestrel (MIRENA) 20 MCG/24HR IUD by  Intrauterine route.    . Multiple Vitamins-Minerals (MULTIVITAMIN PO) Take 1 tablet by mouth every morning.     No current facility-administered medications for this visit.      ALLERGIES: Review of patient's allergies indicates no known allergies.       Family History  Problem Relation Age of Onset  . Breast cancer Mother   . Diabetes Father   . Hypertension Father   . Dementia Father   . Stroke Maternal Grandfather   . Lymphoma Paternal Grandmother   . Heart attack Paternal Grandfather     Social History        Social History  . Marital status: Married    Spouse name: N/A  . Number of children: N/A  . Years of education: N/A      Occupational History  . Not on file.         Social History Main Topics  . Smoking status: Former Games developermoker  . Smokeless tobacco: Never Used  . Alcohol use 6.0 oz/week    10 Glasses of wine per week  . Drug use: No  . Sexual activity: Yes    Partners: Male     Birth control/ protection: IUD     Comment: Mirena inserted 11-28-15       Other Topics Concern  . Not on file      Social History Narrative  . No narrative on file    ROS:  Pertinent items are noted in HPI.  PHYSICAL EXAMINATION:    BP 120/76 (BP Location: Right Arm, Patient Position: Sitting, Cuff Size: Normal)   Pulse (!) 56   Ht 5' 3.25" (1.607 m)   LMP 08/16/2015 (Exact Date)     General appearance: alert, cooperative and appears stated age Head: Normocephalic, without obvious abnormality, atraumatic Neck: no adenopathy, supple, symmetrical, trachea midline and thyroid normal to inspection and palpation Lungs: clear to auscultation bilaterall  Heart: regular rate and rhythm Abdomen: soft, non-tender, no masses,  no organomegaly  Neurologic: Grossly normal  Chaperone was present for exam.  ASSESSMENT  Genuine stress incontinence.  Cystocele.  PLAN  Proceed with TVT Exact midurethral sling/cystoscopy/anterior colporrhaphy.  Use of permanent mesh reviewed along with potential risks of erosion, exposure, painful intercourse, reoperation.  Risks/benefits/alternatives of surgery discussed with patient who wishes to proceed.  Surgical expectations and recovery discussed.  She is prepared not to lift over 10 pounds and not do exercise/running for 8 week post op. Patient wishes to proceed.    An After Visit Summary was printed and given to the patient.

## 2015-12-17 NOTE — Anesthesia Preprocedure Evaluation (Addendum)
Anesthesia Evaluation  Patient identified by MRN, date of birth, ID band Patient awake    Reviewed: Allergy & Precautions, NPO status , Patient's Chart, lab work & pertinent test results  Airway Mallampati: II  TM Distance: >3 FB Neck ROM: Full    Dental  (+) Teeth Intact   Pulmonary former smoker,    Pulmonary exam normal breath sounds clear to auscultation       Cardiovascular Exercise Tolerance: Good negative cardio ROS Normal cardiovascular exam+ Valvular Problems/Murmurs  Rhythm:Regular Rate:Normal - Systolic murmurs and - Diastolic murmurs    Neuro/Psych PSYCHIATRIC DISORDERS Anxiety Depression Hx/o Anorexia nervosa   GI/Hepatic Neg liver ROS, Celiac disease   Endo/Other  Amenorrhea  Renal/GU negative Renal ROS Bladder dysfunction  Genuine SUI Cystocele    Musculoskeletal negative musculoskeletal ROS (+)   Abdominal   Peds  Hematology  (+) anemia ,   Anesthesia Other Findings   Reproductive/Obstetrics negative OB ROS                             Chemistry      Component Value Date/Time   NA 138 12/07/2015 1335   K 4.3 12/07/2015 1335   CL 104 12/07/2015 1335   CO2 25 12/07/2015 1335   BUN 23 (H) 12/07/2015 1335   CREATININE 0.75 12/07/2015 1335      Component Value Date/Time   CALCIUM 9.5 12/07/2015 1335     Lab Results  Component Value Date   WBC 8.1 12/07/2015   HGB 12.9 12/07/2015   HCT 36.5 12/07/2015   MCV 90.3 12/07/2015   PLT 284 12/07/2015   Lab Results  Component Value Date   PREGTESTUR NEGATIVE 12/18/2015    Anesthesia Physical Anesthesia Plan  ASA: II  Anesthesia Plan: General   Post-op Pain Management:    Induction: Intravenous  Airway Management Planned: Oral ETT  Additional Equipment:   Intra-op Plan:   Post-operative Plan: Extubation in OR  Informed Consent: I have reviewed the patients History and Physical, chart, labs and  discussed the procedure including the risks, benefits and alternatives for the proposed anesthesia with the patient or authorized representative who has indicated his/her understanding and acceptance.   Dental advisory given  Plan Discussed with: Anesthesiologist, CRNA and Surgeon  Anesthesia Plan Comments:         Anesthesia Quick Evaluation

## 2015-12-18 ENCOUNTER — Ambulatory Visit (HOSPITAL_COMMUNITY): Payer: Managed Care, Other (non HMO) | Admitting: Anesthesiology

## 2015-12-18 ENCOUNTER — Encounter (HOSPITAL_COMMUNITY)
Admission: RE | Disposition: A | Discharge status: Home / Self Care | Payer: Self-pay | Source: Ambulatory Visit | Attending: Obstetrics and Gynecology

## 2015-12-18 ENCOUNTER — Ambulatory Visit (HOSPITAL_COMMUNITY)
Admission: RE | Admit: 2015-12-18 | Discharge: 2015-12-18 | Disposition: A | Discharge status: Home / Self Care | Payer: Managed Care, Other (non HMO) | Source: Ambulatory Visit | Attending: Obstetrics and Gynecology | Admitting: Obstetrics and Gynecology

## 2015-12-18 ENCOUNTER — Encounter (HOSPITAL_COMMUNITY): Payer: Self-pay

## 2015-12-18 DIAGNOSIS — D649 Anemia, unspecified: Secondary | ICD-10-CM | POA: Insufficient documentation

## 2015-12-18 DIAGNOSIS — F419 Anxiety disorder, unspecified: Secondary | ICD-10-CM | POA: Insufficient documentation

## 2015-12-18 DIAGNOSIS — Z8249 Family history of ischemic heart disease and other diseases of the circulatory system: Secondary | ICD-10-CM | POA: Insufficient documentation

## 2015-12-18 DIAGNOSIS — N912 Amenorrhea, unspecified: Secondary | ICD-10-CM | POA: Insufficient documentation

## 2015-12-18 DIAGNOSIS — N8110 Cystocele, unspecified: Secondary | ICD-10-CM | POA: Diagnosis not present

## 2015-12-18 DIAGNOSIS — Z87891 Personal history of nicotine dependence: Secondary | ICD-10-CM | POA: Insufficient documentation

## 2015-12-18 DIAGNOSIS — Z833 Family history of diabetes mellitus: Secondary | ICD-10-CM | POA: Insufficient documentation

## 2015-12-18 DIAGNOSIS — N393 Stress incontinence (female) (male): Secondary | ICD-10-CM | POA: Diagnosis not present

## 2015-12-18 DIAGNOSIS — Z8489 Family history of other specified conditions: Secondary | ICD-10-CM | POA: Insufficient documentation

## 2015-12-18 DIAGNOSIS — F329 Major depressive disorder, single episode, unspecified: Secondary | ICD-10-CM | POA: Insufficient documentation

## 2015-12-18 DIAGNOSIS — R011 Cardiac murmur, unspecified: Secondary | ICD-10-CM | POA: Diagnosis not present

## 2015-12-18 DIAGNOSIS — Z803 Family history of malignant neoplasm of breast: Secondary | ICD-10-CM | POA: Insufficient documentation

## 2015-12-18 DIAGNOSIS — Z823 Family history of stroke: Secondary | ICD-10-CM | POA: Diagnosis not present

## 2015-12-18 DIAGNOSIS — K900 Celiac disease: Secondary | ICD-10-CM | POA: Diagnosis not present

## 2015-12-18 HISTORY — PX: CYSTOSCOPY: SHX5120

## 2015-12-18 HISTORY — PX: CYSTOCELE REPAIR: SHX163

## 2015-12-18 HISTORY — PX: BLADDER SUSPENSION: SHX72

## 2015-12-18 LAB — PREGNANCY, URINE: Preg Test, Ur: NEGATIVE

## 2015-12-18 SURGERY — COLPORRHAPHY, ANTERIOR, FOR CYSTOCELE REPAIR
Anesthesia: General | Site: Vagina

## 2015-12-18 MED ORDER — PROPOFOL 10 MG/ML IV BOLUS
INTRAVENOUS | Status: AC
  Filled 2015-12-18: qty 20

## 2015-12-18 MED ORDER — MIDAZOLAM HCL 2 MG/2ML IJ SOLN
INTRAMUSCULAR | Status: AC
  Filled 2015-12-18: qty 2

## 2015-12-18 MED ORDER — SCOPOLAMINE 1 MG/3DAYS TD PT72
1.0000 | MEDICATED_PATCH | Freq: Once | TRANSDERMAL | Status: DC
  Administered 2015-12-18: 1.5 mg via TRANSDERMAL

## 2015-12-18 MED ORDER — STERILE WATER FOR IRRIGATION IR SOLN
Status: DC | PRN
  Administered 2015-12-18: 600 mL via INTRAVESICAL

## 2015-12-18 MED ORDER — NEOSTIGMINE METHYLSULFATE 10 MG/10ML IV SOLN
INTRAVENOUS | Status: AC
  Filled 2015-12-18: qty 1

## 2015-12-18 MED ORDER — DEXAMETHASONE SODIUM PHOSPHATE 10 MG/ML IJ SOLN
INTRAMUSCULAR | Status: AC
  Filled 2015-12-18: qty 1

## 2015-12-18 MED ORDER — SCOPOLAMINE 1 MG/3DAYS TD PT72
MEDICATED_PATCH | TRANSDERMAL | Status: AC
  Administered 2015-12-18: 1.5 mg via TRANSDERMAL
  Filled 2015-12-18: qty 1

## 2015-12-18 MED ORDER — ESTRADIOL 0.1 MG/GM VA CREA
TOPICAL_CREAM | VAGINAL | Status: AC
  Filled 2015-12-18: qty 42.5

## 2015-12-18 MED ORDER — DEXAMETHASONE SODIUM PHOSPHATE 4 MG/ML IJ SOLN
INTRAMUSCULAR | Status: DC | PRN
  Administered 2015-12-18: 10 mg via INTRAVENOUS

## 2015-12-18 MED ORDER — GLYCOPYRROLATE 0.2 MG/ML IJ SOLN
INTRAMUSCULAR | Status: DC | PRN
  Administered 2015-12-18: 0.1 mg via INTRAVENOUS
  Administered 2015-12-18: 0.4 mg via INTRAVENOUS

## 2015-12-18 MED ORDER — HYDROMORPHONE HCL 1 MG/ML IJ SOLN: 0.2500 mg | INTRAMUSCULAR | Status: DC | PRN

## 2015-12-18 MED ORDER — PHENYLEPHRINE 40 MCG/ML (10ML) SYRINGE FOR IV PUSH (FOR BLOOD PRESSURE SUPPORT)
PREFILLED_SYRINGE | INTRAVENOUS | Status: AC
  Filled 2015-12-18: qty 10

## 2015-12-18 MED ORDER — LACTATED RINGERS IV SOLN: INTRAVENOUS | Status: DC

## 2015-12-18 MED ORDER — METOCLOPRAMIDE HCL 5 MG/ML IJ SOLN: 10.0000 mg | Freq: Once | INTRAMUSCULAR | Status: DC | PRN

## 2015-12-18 MED ORDER — LACTATED RINGERS IV SOLN
INTRAVENOUS | Status: DC
  Administered 2015-12-18 (×2): via INTRAVENOUS

## 2015-12-18 MED ORDER — LACTATED RINGERS IV SOLN
INTRAVENOUS | Status: AC
  Administered 2015-12-18: 500 mL/h via INTRAVENOUS

## 2015-12-18 MED ORDER — PROPOFOL 10 MG/ML IV BOLUS
INTRAVENOUS | Status: DC | PRN
  Administered 2015-12-18: 150 mg via INTRAVENOUS

## 2015-12-18 MED ORDER — ROCURONIUM BROMIDE 100 MG/10ML IV SOLN
INTRAVENOUS | Status: AC
  Filled 2015-12-18: qty 1

## 2015-12-18 MED ORDER — LIDOCAINE HCL (CARDIAC) 20 MG/ML IV SOLN
INTRAVENOUS | Status: AC
  Filled 2015-12-18: qty 5

## 2015-12-18 MED ORDER — LIDOCAINE-EPINEPHRINE 1 %-1:100000 IJ SOLN
INTRAMUSCULAR | Status: DC | PRN
  Administered 2015-12-18: 5 mL

## 2015-12-18 MED ORDER — EPHEDRINE SULFATE 50 MG/ML IJ SOLN
INTRAMUSCULAR | Status: DC | PRN
  Administered 2015-12-18 (×2): 5 mg via INTRAVENOUS

## 2015-12-18 MED ORDER — FENTANYL CITRATE (PF) 100 MCG/2ML IJ SOLN
INTRAMUSCULAR | Status: DC | PRN
  Administered 2015-12-18 (×2): 50 ug via INTRAVENOUS

## 2015-12-18 MED ORDER — LIDOCAINE-EPINEPHRINE 1 %-1:100000 IJ SOLN
INTRAMUSCULAR | Status: AC
  Filled 2015-12-18: qty 1

## 2015-12-18 MED ORDER — LIDOCAINE HCL (CARDIAC) 20 MG/ML IV SOLN
INTRAVENOUS | Status: DC | PRN
  Administered 2015-12-18: 50 mg via INTRAVENOUS

## 2015-12-18 MED ORDER — KETOROLAC TROMETHAMINE 30 MG/ML IJ SOLN
INTRAMUSCULAR | Status: DC | PRN
  Administered 2015-12-18: 30 mg via INTRAVENOUS

## 2015-12-18 MED ORDER — ONDANSETRON HCL 4 MG/2ML IJ SOLN
INTRAMUSCULAR | Status: DC | PRN
Start: 2015-12-18 — End: 2015-12-18
  Administered 2015-12-18: 4 mg via INTRAVENOUS

## 2015-12-18 MED ORDER — MIDAZOLAM HCL 2 MG/2ML IJ SOLN
INTRAMUSCULAR | Status: DC | PRN
  Administered 2015-12-18: 2 mg via INTRAVENOUS

## 2015-12-18 MED ORDER — KETOROLAC TROMETHAMINE 30 MG/ML IJ SOLN
INTRAMUSCULAR | Status: AC
  Filled 2015-12-18: qty 1

## 2015-12-18 MED ORDER — ROCURONIUM BROMIDE 100 MG/10ML IV SOLN
INTRAVENOUS | Status: DC | PRN
  Administered 2015-12-18: 5 mg via INTRAVENOUS
  Administered 2015-12-18: 25 mg via INTRAVENOUS
  Administered 2015-12-18: 5 mg via INTRAVENOUS
  Administered 2015-12-18: 10 mg via INTRAVENOUS
  Administered 2015-12-18: 5 mg via INTRAVENOUS

## 2015-12-18 MED ORDER — HYDROMORPHONE HCL 1 MG/ML IJ SOLN
INTRAMUSCULAR | Status: AC
  Filled 2015-12-18: qty 1

## 2015-12-18 MED ORDER — EPHEDRINE 5 MG/ML INJ
INTRAVENOUS | Status: AC
  Filled 2015-12-18: qty 10

## 2015-12-18 MED ORDER — MEPERIDINE HCL 25 MG/ML IJ SOLN: 6.2500 mg | INTRAMUSCULAR | Status: DC | PRN

## 2015-12-18 MED ORDER — FENTANYL CITRATE (PF) 250 MCG/5ML IJ SOLN
INTRAMUSCULAR | Status: AC
  Filled 2015-12-18: qty 5

## 2015-12-18 MED ORDER — ONDANSETRON HCL 4 MG/2ML IJ SOLN
INTRAMUSCULAR | Status: AC
  Filled 2015-12-18: qty 2

## 2015-12-18 MED ORDER — NEOSTIGMINE METHYLSULFATE 10 MG/10ML IV SOLN
INTRAVENOUS | Status: DC | PRN
  Administered 2015-12-18: 2.5 mg via INTRAVENOUS

## 2015-12-18 SURGICAL SUPPLY — 48 items
BLADE SURG 11 STRL SS (BLADE) ×4 IMPLANT
BLADE SURG 15 STRL LF C SS BP (BLADE) ×2 IMPLANT
BLADE SURG 15 STRL SS (BLADE) ×2
CANISTER SUCT 3000ML (MISCELLANEOUS) ×4 IMPLANT
CATH FOLEY 2WAY SLVR  5CC 18FR (CATHETERS) ×2
CATH FOLEY 2WAY SLVR 5CC 18FR (CATHETERS) ×2 IMPLANT
CLOTH BEACON ORANGE TIMEOUT ST (SAFETY) ×4 IMPLANT
CONT PATH 16OZ SNAP LID 3702 (MISCELLANEOUS) IMPLANT
COUNTER NEEDLE 1200 MAGNETIC (NEEDLE) ×4 IMPLANT
DECANTER SPIKE VIAL GLASS SM (MISCELLANEOUS) ×4 IMPLANT
DEVICE CAPIO SLIM SINGLE (INSTRUMENTS) IMPLANT
DURAPREP 26ML APPLICATOR (WOUND CARE) ×4 IMPLANT
GAUZE PACKING 2X5 YD STRL (GAUZE/BANDAGES/DRESSINGS) ×4 IMPLANT
GLOVE BIO SURGEON STRL SZ 6.5 (GLOVE) ×3 IMPLANT
GLOVE BIO SURGEONS STRL SZ 6.5 (GLOVE) ×1
GLOVE BIOGEL PI IND STRL 6.5 (GLOVE) ×2 IMPLANT
GLOVE BIOGEL PI IND STRL 7.0 (GLOVE) ×4 IMPLANT
GLOVE BIOGEL PI INDICATOR 6.5 (GLOVE) ×2
GLOVE BIOGEL PI INDICATOR 7.0 (GLOVE) ×4
GOWN STRL REUS W/TWL LRG LVL3 (GOWN DISPOSABLE) ×16 IMPLANT
LIQUID BAND (GAUZE/BANDAGES/DRESSINGS) ×4 IMPLANT
MATRIX HEMOSTAT SURGIFLO (HEMOSTASIS) ×4 IMPLANT
NEEDLE HYPO 22GX1.5 SAFETY (NEEDLE) ×4 IMPLANT
NEEDLE MAYO 6 CRC TAPER PT (NEEDLE) IMPLANT
NS IRRIG 1000ML POUR BTL (IV SOLUTION) ×4 IMPLANT
PACK VAGINAL WOMENS (CUSTOM PROCEDURE TRAY) ×4 IMPLANT
PLUG CATH AND CAP STER (CATHETERS) ×4 IMPLANT
SET CYSTO W/LG BORE CLAMP LF (SET/KITS/TRAYS/PACK) ×4 IMPLANT
SLING TVT EXACT (Sling) ×4 IMPLANT
SURGIFLO TRUKIT (HEMOSTASIS) IMPLANT
SURGIFLO W/THROMBIN 8M KIT (HEMOSTASIS) ×4 IMPLANT
SUT CAPIO ETHIBPND (SUTURE) IMPLANT
SUT VIC AB 0 CT1 27 (SUTURE) ×6
SUT VIC AB 0 CT1 27XBRD ANBCTR (SUTURE) ×6 IMPLANT
SUT VIC AB 2-0 CT1 27 (SUTURE)
SUT VIC AB 2-0 CT1 TAPERPNT 27 (SUTURE) IMPLANT
SUT VIC AB 2-0 CT2 27 (SUTURE) IMPLANT
SUT VIC AB 2-0 SH 27 (SUTURE) ×6
SUT VIC AB 2-0 SH 27XBRD (SUTURE) ×6 IMPLANT
SUT VIC AB 2-0 UR6 27 (SUTURE) IMPLANT
SUT VIC AB 3-0 SH 27 (SUTURE) ×2
SUT VIC AB 3-0 SH 27X BRD (SUTURE) ×2 IMPLANT
TOWEL OR 17X24 6PK STRL BLUE (TOWEL DISPOSABLE) ×8 IMPLANT
TRAY FOLEY CATH SILVER 14FR (SET/KITS/TRAYS/PACK) ×4 IMPLANT
TRAY FOLEY CATH SILVER 16FR (SET/KITS/TRAYS/PACK) ×4 IMPLANT
TUBING NON-CON 1/4 X 20 CONN (TUBING) IMPLANT
TUBING NON-CON 1/4 X 20' CONN (TUBING)
WATER STERILE IRR 1000ML POUR (IV SOLUTION) ×4 IMPLANT

## 2015-12-18 NOTE — Progress Notes (Signed)
Update to History and Physical  No marked change in status since office preop visit.  Low resting heart rate.  Patient is a runner. OK to proceed with surgery.

## 2015-12-18 NOTE — Transfer of Care (Signed)
Immediate Anesthesia Transfer of Care Note  Patient: [name]  Procedure(s) Performed: Procedure(s): ANTERIOR REPAIR (CYSTOCELE) (N/A) TRANSVAGINAL TAPE (TVT) PROCEDURE exact midurethral sling (N/A) CYSTOSCOPY (N/A)  Patient Location: PACU  Anesthesia Type:General  Level of Consciousness: awake, alert  and oriented  Airway & Oxygen Therapy: Patient Spontanous Breathing and Patient connected to nasal cannula oxygen  Post-op Assessment: Report given to RN and Post -op Vital signs reviewed and stable  Post vital signs: Reviewed and stable  Last Vitals:  Vitals:   12/18/15 0605  BP: 132/86  Pulse: (!) 48  Resp: 16  Temp: 36.4 C    Last Pain:  Vitals:   12/18/15 0605  TempSrc: Oral      Patients Stated Pain Goal: 3 (12/18/15 [id number])  Complications: No apparent anesthesia complications

## 2015-12-18 NOTE — Anesthesia Procedure Notes (Signed)
Procedure Name: Intubation Date/Time: 12/18/2015 7:29 AM Performed by: Graciela HusbandsFUSSELL, Haeden Hudock O Pre-anesthesia Checklist: Patient identified, Emergency Drugs available, Suction available, Patient being monitored and Timeout performed Patient Re-evaluated:Patient Re-evaluated prior to inductionOxygen Delivery Method: Circle system utilized Preoxygenation: Pre-oxygenation with 100% oxygen Intubation Type: IV induction Ventilation: Mask ventilation without difficulty Grade View: Grade I Tube type: Oral Tube size: 7.0 mm Number of attempts: 1 Airway Equipment and Method: Stylet Placement Confirmation: ETT inserted through vocal cords under direct vision,  positive ETCO2 and breath sounds checked- equal and bilateral Secured at: 21 cm Tube secured with: Tape Dental Injury: Teeth and Oropharynx as per pre-operative assessment

## 2015-12-18 NOTE — Addendum Note (Signed)
Addendum  created 12/18/15 1015 by Shanon PayorSuzanne M Biance Moncrief, CRNA   Anesthesia Staff edited

## 2015-12-18 NOTE — Anesthesia Postprocedure Evaluation (Signed)
Anesthesia Post Note  Patient: Valaria GoodKelly C Yzaguirre  Procedure(s) Performed: Procedure(s) (LRB): ANTERIOR REPAIR (CYSTOCELE) (N/A) TRANSVAGINAL TAPE (TVT) PROCEDURE exact midurethral sling (N/A) CYSTOSCOPY (N/A)  Patient location during evaluation: PACU Anesthesia Type: General Level of consciousness: awake and alert and oriented Pain management: pain level controlled Vital Signs Assessment: post-procedure vital signs reviewed and stable Respiratory status: spontaneous breathing, nonlabored ventilation, respiratory function stable and patient connected to nasal cannula oxygen Cardiovascular status: blood pressure returned to baseline and stable Postop Assessment: no signs of nausea or vomiting Anesthetic complications: no     Last Vitals:  Vitals:   12/18/15 0930 12/18/15 0945  BP: 119/79 122/76  Pulse: (!) 49 (!) 52  Resp: 11 11  Temp:      Last Pain:  Vitals:   12/18/15 0945  TempSrc:   PainSc: 2    Pain Goal: Patients Stated Pain Goal: 3 (12/18/15 [id number])               Aryiah Monterosso A.

## 2015-12-18 NOTE — Discharge Instructions (Signed)
Foley Catheter Care, Adult A Foley catheter is a soft, flexible tube. This tube is placed into your bladder to drain pee (urine). If you go home with this catheter in place, follow the instructions below. TAKING CARE OF THE CATHETER 1. Wash your hands with soap and water. 2. Put soap and water on a clean washcloth.  Clean the skin where the tube goes into your body.  Clean away from the tube site.  Never wipe toward the tube.  Clean the area using a circular motion.  Remove all the soap. Pat the area dry with a clean towel. For males, reposition the skin that covers the end of the penis (foreskin). 3. Attach the tube to your leg with tape or a leg strap. Do not stretch the tube tight. If you are using tape, remove any stickiness left behind by past tape you used. 4. Keep the drainage bag below your hips. Keep it off the floor. 5. Check your tube during the day. Make sure it is working and draining. Make sure the tube does not curl, twist, or bend. 6. Do not pull on the tube or try to take it out. TAKING CARE OF THE DRAINAGE BAGS You will have a large overnight drainage bag and a small leg bag. You may wear the overnight bag any time. Never wear the small bag at night. Follow the directions below. Emptying the Drainage Bag Empty your drainage bag when it is  - full or at least 2-3 times a day. 1. Wash your hands with soap and water. 2. Keep the drainage bag below your hips. 3. Hold the dirty bag over the toilet or clean container. 4. Open the pour spout at the bottom of the bag. Empty the pee into the toilet or container. Do not let the pour spout touch anything. 5. Clean the pour spout with a gauze pad or cotton ball that has rubbing alcohol on it. 6. Close the pour spout. 7. Attach the bag to your leg with tape or a leg strap. 8. Wash your hands well. Changing the Drainage Bag Change your bag once a month or sooner if it starts to smell or look dirty.  1. Wash your hands with soap  and water. 2. Pinch the rubber tube so that pee does not spill out. 3. Disconnect the catheter tube from the drainage tube at the connection valve. Do not let the tubes touch anything. 4. Clean the end of the catheter tube with an alcohol wipe. Clean the end of a the drainage tube with a different alcohol wipe. 5. Connect the catheter tube to the drainage tube of the clean drainage bag. 6. Attach the new bag to the leg with tape or a leg strap. Avoid attaching the new bag too tightly. 7. Wash your hands well. Cleaning the Drainage Bag 1. Wash your hands with soap and water. 2. Wash the bag in warm, soapy water. 3. Rinse the bag with warm water. 4. Fill the bag with a mixture of white vinegar and water (1 cup vinegar to 1 quart warm water [.2 liter vinegar to 1 liter warm water]). Close the bag and soak it for 30 minutes in the solution. 5. Rinse the bag with warm water. 6. Hang the bag to dry with the pour spout open and hanging downward. 7. Store the clean bag (once it is dry) in a clean plastic bag. 8. Wash your hands well. PREVENT INFECTION  Wash your hands before and after touching your tube.  Take showers every day. Wash the skin where the tube enters your body. Do not take baths. Replace wet leg straps with dry ones, if this applies.  Do not use powders, sprays, or lotions on the genital area. Only use creams, lotions, or ointments as told by your doctor.  For females, wipe from front to back after going to the bathroom.  Drink enough fluids to keep your pee clear or pale yellow unless you are told not to have too much fluid (fluid restriction).  Do not let the drainage bag or tubing touch or lie on the floor.  Wear cotton underwear to keep the area dry. GET HELP IF:  Your pee is cloudy or smells unusually bad.  Your tube becomes clogged.  You are not draining pee into the bag or your bladder feels full.  Your tube starts to leak. GET HELP RIGHT AWAY IF:  You have  pain, puffiness (swelling), redness, or yellowish-white fluid (pus) where the tube enters the body.  You have pain in the belly (abdomen), legs, lower back, or bladder.  You have a fever.  You see blood fill the tube, or your pee is pink or red.  You feel sick to your stomach (nauseous), throw up (vomit), or have chills.  Your tube gets pulled out. MAKE SURE YOU:   Understand these instructions.  Will watch your condition.  Will get help right away if you are not doing well or get worse.   This information is not intended to replace advice given to you by your health care provider. Make sure you discuss any questions you have with your health care provider.   Document Released: 05/25/2012 Document Revised: 02/18/2014 Document Reviewed: 05/25/2012 Elsevier Interactive Patient Education 2016 [employer]  Post Anesthesia Home Care Instructions  Activity: Get plenty of rest for the remainder of the day. A responsible adult should stay with you for 24 hours following the procedure.  For the next 24 hours, DO NOT: -Drive a car -Advertising copywriterperate machinery -Drink alcoholic beverages -Take any medication unless instructed by your physician -Make any legal decisions or sign important papers.  Meals: Start with liquid foods such as gelatin or soup. Progress to regular foods as tolerated. Avoid greasy, spicy, heavy foods. If nausea and/or vomiting occur, drink only clear liquids until the nausea and/or vomiting subsides. Call your physician if vomiting continues.  Special Instructions/Symptoms: Your throat may feel dry or sore from the anesthesia or the breathing tube placed in your throat during surgery. If this causes discomfort, gargle with warm salt water. The discomfort should disappear within 24 hours.  If you had a scopolamine patch placed behind your ear for the management of post- operative nausea and/or vomiting:  1. The medication in the patch is effective for 72 hours, after  which it should be removed.  Wrap patch in a tissue and discard in the trash. Wash hands thoroughly with soap and water. 2. You may remove the patch earlier than 72 hours if you experience unpleasant side effects which may include dry mouth, dizziness or visual disturbances. 3. Avoid touching the patch. Wash your hands with soap and water after contact with the patch.   Hi Michelle Ward,   Your surgery went well.  I did the TVT Exact midurethral sling, cystoscopy, and anterior colporrhaphy as planned.  Surgery went well!  See you on Friday.   Conley SimmondsBrook Silva, MD    Urethral Vaginal Sling, Care After Refer to this sheet in the next few weeks. These  instructions provide you with information on caring for yourself after your procedure. Your health care provider may also give you more specific instructions. Your treatment has been planned according to current medical practices, but problems sometimes occur. Call your health care provider if you have any problems or questions after your procedure.  WHAT TO EXPECT AFTER THE PROCEDURE  After your procedure, it is typical to have the following:  A catheter in your bladder until your bladder is able to work on its own properly. You will be instructed on how to empty the catheter bag.  Absorbable stitches in your incisions. They will slowly dissolve over 1-2 months. HOME CARE INSTRUCTIONS  Get plenty of rest.  Only take over-the-counter or prescription medicines as directed by your health care provider. Do not take aspirin because it can cause bleeding.  Do not take baths. Take showers until your health care provider tells you otherwise.  You may resume your usual diet. Eat a well-balanced diet.  Drink enough fluids to keep your urine clear or pale yellow.  Limit exercise and activities as directed by your health care provider. Do not lift anything heavier than 5 pounds (2.3 kg).  Do not douche, use tampons, or have sexual intercourse for 6 weeks after  your procedure.  Follow up with your health care provider as directed. SEEK MEDICAL CARE IF:  You have a heavy or bad smelling vaginal discharge.   You have a rash.   You have pain that is not controlled with medicines.   You have lightheadedness or feel faint.  SEEK IMMEDIATE MEDICAL CARE IF:  You have a fever.   You have vaginal bleeding.   You faint.   You have shortness of breath.   You have chest, abdominal, or leg pain.   You have pain when urinating or cannot urinate.   Your catheter is still in your bladder and becomes blocked.   You have swelling, redness, and pain in the vaginal area.    This information is not intended to replace advice given to you by your health care provider. Make sure you discuss any questions you have with your health care provider.   Document Released: 11/18/2012 Document Reviewed: 11/18/2012 Elsevier Interactive Patient Education 2016 Elsevier Inc.  Anterior and Posterior Colporrhaphy, Care After Refer to this sheet in the next few weeks. These instructions provide you with information on caring for yourself after your procedure. Your health care provider may also give you more specific instructions. Your treatment has been planned according to current medical practices, but problems sometimes occur. Call your health care provider if you have any problems or questions after your procedure. HOME CARE INSTRUCTIONS   Take frequent rest periods throughout the day.   Only take over-the-counter or prescription medicines as directed by your health care provider.   Avoid strenuous activity such as heavy lifting (more than 10 pounds [4.5 kg]), pushing, and pulling until your health care provider says it is okay.   Take showers if your health care provider approves. Pat incisions dry. Do not rub incisions with a washcloth or towel. Do not take tub baths until your health care provider approves.   Wear compression stockings as  directed by your health care provider. These stockings help prevent blood clots from forming in your legs.   Talk with your health care provider about when you may return to work and your exercise routine.   Do not drive until your health care provider approves.   You may  resume your normal diet. Eat a well-balanced diet.   Drink enough fluids to keep your urine clear or pale yellow.   Your normal bowel function should return. If you become constipated, you may:   Take a mild laxative.   Add fruit and bran to your diet.   Drink more fluids.  Do not have sexual intercourse until permitted by your health care provider.  Follow up with your health care provider as directed. SEEK MEDICAL CARE IF: You have persistent nausea or vomiting.  SEEK IMMEDIATE MEDICAL CARE IF:   You have increased bleeding (more than a small spot) from the vaginal area.   Your pain is not relieved with medicine or becomes worse.   You have redness, swelling, or increasing pain in the vaginal area.   You have abdominal pain.   You see pus coming from the wounds.   You develop a fever.   You have a foul smell coming from your vaginal area.   You develop light-headedness or you feel faint.   You have difficulty breathing.  MAKE SURE YOU:  Understand these instructions.  Will watch your condition.  Will get help right away if you are not doing well or get worse.   This information is not intended to replace advice given to you by your health care provider. Make sure you discuss any questions you have with your health care provider.   Document Released: 08/16/2004 Document Revised: 09/30/2012 Document Reviewed: 06/19/2012 Elsevier Interactive Patient Education [employer] Elsevier Inc[employer]

## 2015-12-18 NOTE — Op Note (Signed)
OPERATIVE REPORT   PREOPERATIVE DIAGNOSES:  Genuine stress incontinence, cystocele.  POSTOPERATIVE DIAGNOSES:  Genuine stress incontinence, cystocele.  PROCEDURES:  TVT Exact midurethral sling and cystoscopy,  anterior colporrhaphy   SURGEON:  Randye LoboBrook E. Silva, M.D.  ASSISTANT:   Tobi BastosJill E. Jertson, M.D.  ANESTHESIA:  General endotracheal, local with 1% lidocaine with epinephrine, 1:100,000  EBL:   100 cc  URINE OUTPUT:   100 cc  IV FLUIDS:   1500 cc.  COMPLICATIONS:  None.  INDICATIONS FOR THE PROCEDURE:  The patient is a 45 year old gravida 2, para 2 Caucasian female who presents with urinary incontinence with exercise.  On physical exam, the patient was noted to have a first degree cystocele.  The patient underwent simple cystometric testing and was diagnosed with genuine stress incontinence.  The patient is wishing for surgical repair and a plan is made to proceed now with an anterior colporrhaphy along with the TVT Exact midurethral sling and cystoscopy. Risks, benefits, and alternatives are reviewed with the patient, who wishes to proceed.  FINDINGS:  Examination under anesthesia revealed a first degree cystocele.  The uterus was small, and no adnexal masses were noted. The IUD strings were noted from the cervix.  The bladder was visualized throughout 360 degrees and was normal.  There  was no foreign body in the bladder or the urethra.  The ureters were noted to be patent bilaterally.  SPECIMENS:  None.  DESCRIPTION OF PROCEDURE:  The patient was reidentified in the preoperative hold area.  She received Ciprofloxacin IV for antibiotic prophylaxis.  She received TED hose and PAS stockings for DVT prophylaxis.  The patient was transferred to the operating room where she was placed in the dorsal lithotomy position with Allen stirrups.  General endotracheal anesthesia was induced.  The patient's lower abdomen, vagina and perineum were then sterilely prepped and she was draped.  A Foley  catheter was sterilely placed inside the bladder and left to gravity drainage throughout the procedure.  An examination under anesthesia was performed.  Allis clamps were used to mark the anterior vaginal wall from 1 cm below the urethra down to 2 cm above the cervix. The anterior vaginal wall mucosa was injected locally with 1% lidocaine with epinephrine, 1:100,000.  The vaginal mucosa was then incised vertically in the midline with the scalpel.  With a combination of sharp and blunt dissection, the subvaginal tissue was dissected off the bladder bilaterally.  The dissection was carried back to the pubic rami anteriorly.    The TVT Exact midurethral sling was performed.  The 1 cm suprapubic incisions were created with a scalpel to the right and left of the midline.  The TVT Exact was performed in a bottom-up fashion.  The Foley catheter was removed and the Foley tip with the obturator guide was placed inside the urethra and deflected properly.  The guide was placed through the right retropubic space and then up through the right suprapubic incision.  This was performed without difficulty.  The urethra was deflected in opposite direction and the same was then performed on the patient's left-hand side.  The obturator guide was removed and cystoscopy was performed and the findings were as noted above.  All cystoscopic fluid was drained and the Foley catheter was replaced. The sling was brought up through the suprapubic incisions bilaterally. A Mavi clamp was placed between the sling and the urethra, and the plastic sheaths were removed.  The sling was trimmed suprapubically. The sling was noted to  be in good position.  The anterior colporrhaphy was performed with vertical mattress sutures of 2/0 Vicryl for reduction of the cystocele.   Surgiflo was then placed over the anterior colporrhaphy repair and up near the sites where the midurethral sling went into the retropubic space.   Hemostasis was  good.  The anterior vaginal wall mucosa was trimmed and then the anterior vaginal wall was closed with a running locked suture of 2-0 Vicryl.  The suprapubic incisions were closed with Dermabond.  The foley catheter was removed.   The patient was cleansed of betadine.  She was awakened and extubated.  She was escorted to the recovery room in stable and awake condition.   There were no complications to the procedure.  All needle, instrument, and sponges counts were correct.

## 2015-12-19 ENCOUNTER — Encounter (HOSPITAL_COMMUNITY): Payer: Self-pay | Admitting: Obstetrics and Gynecology

## 2015-12-22 ENCOUNTER — Encounter: Payer: Self-pay | Admitting: Obstetrics and Gynecology

## 2015-12-22 ENCOUNTER — Encounter (HOSPITAL_COMMUNITY): Payer: Self-pay | Admitting: *Deleted

## 2015-12-22 ENCOUNTER — Emergency Department (HOSPITAL_COMMUNITY): Payer: Managed Care, Other (non HMO)

## 2015-12-22 ENCOUNTER — Ambulatory Visit (INDEPENDENT_AMBULATORY_CARE_PROVIDER_SITE_OTHER): Payer: Managed Care, Other (non HMO) | Admitting: Obstetrics and Gynecology

## 2015-12-22 ENCOUNTER — Emergency Department (HOSPITAL_COMMUNITY)
Admission: EM | Admit: 2015-12-22 | Discharge: 2015-12-22 | Disposition: A | Discharge status: Home / Self Care | Payer: Managed Care, Other (non HMO) | Attending: Emergency Medicine | Admitting: Emergency Medicine

## 2015-12-22 VITALS — BP 110/72 | HR 50 | Temp 98.1°F | Ht 63.25 in | Wt 121.0 lb

## 2015-12-22 DIAGNOSIS — R0609 Other forms of dyspnea: Secondary | ICD-10-CM

## 2015-12-22 DIAGNOSIS — R0600 Dyspnea, unspecified: Secondary | ICD-10-CM | POA: Insufficient documentation

## 2015-12-22 DIAGNOSIS — Z87891 Personal history of nicotine dependence: Secondary | ICD-10-CM | POA: Insufficient documentation

## 2015-12-22 DIAGNOSIS — R339 Retention of urine, unspecified: Secondary | ICD-10-CM

## 2015-12-22 DIAGNOSIS — R0602 Shortness of breath: Secondary | ICD-10-CM | POA: Diagnosis present

## 2015-12-22 DIAGNOSIS — Z9889 Other specified postprocedural states: Secondary | ICD-10-CM

## 2015-12-22 LAB — BASIC METABOLIC PANEL
Anion gap: 7 (ref 5–15)
BUN: 11 mg/dL (ref 6–20)
CHLORIDE: 105 mmol/L (ref 101–111)
CO2: 27 mmol/L (ref 22–32)
Calcium: 9.3 mg/dL (ref 8.9–10.3)
Creatinine, Ser: 0.74 mg/dL (ref 0.44–1.00)
GFR calc Af Amer: >60 mL/min (ref >60)
GFR calc non Af Amer: >60 mL/min (ref >60)
GLUCOSE: 99 mg/dL (ref 65–99)
POTASSIUM: 3.9 mmol/L (ref 3.5–5.1)
Sodium: 139 mmol/L (ref 135–145)

## 2015-12-22 LAB — CBC
HEMATOCRIT: 34.9 % — AB (ref 36.0–46.0)
HEMOGLOBIN: 11.9 g/dL — AB (ref 12.0–15.0)
MCH: 30.9 pg (ref 26.0–34.0)
MCHC: 34.1 g/dL (ref 30.0–36.0)
MCV: 90.6 fL (ref 78.0–100.0)
Platelets: 231 10*3/uL (ref 150–400)
RBC: 3.85 MIL/uL — ABNORMAL LOW (ref 3.87–5.11)
RDW: 13.2 % (ref 11.5–15.5)
WBC: 5.4 10*3/uL (ref 4.0–10.5)

## 2015-12-22 LAB — I-STAT TROPONIN, ED: Troponin i, poc: 0 ng/mL (ref 0.00–0.08)

## 2015-12-22 MED ORDER — IOPAMIDOL (ISOVUE-370) INJECTION 76%
INTRAVENOUS | Status: AC
  Administered 2015-12-22: 100 mL
  Filled 2015-12-22: qty 100

## 2015-12-22 MED ORDER — ALBUTEROL SULFATE (2.5 MG/3ML) 0.083% IN NEBU
2.5000 mg | INHALATION_SOLUTION | Freq: Once | RESPIRATORY_TRACT | Status: AC
  Administered 2015-12-22: 2.5 mg via RESPIRATORY_TRACT
  Filled 2015-12-22: qty 3

## 2015-12-22 NOTE — ED Triage Notes (Signed)
Pt sent [facility] for sob/R sided chest pressure 4 days post cystocele repair.  States sob/chest pressure increasing since procedure.

## 2015-12-22 NOTE — ED Notes (Signed)
Patient transported to CT 

## 2015-12-22 NOTE — Progress Notes (Signed)
GYNECOLOGY  VISIT   HPI: 45 y.o.   Married  Caucasian  female   [id number] with Patient's last menstrual period was 08/16/2015 (exact date).   here for 4 day follow up ANTERIOR REPAIR (CYSTOCELE) (N/A Vagina ) TRANSVAGINAL TAPE (TVT) PROCEDURE exact midurethral sling (N/A Vagina ) CYSTOSCOPY (N/A Bladder). Patient's husband is present for the visit today.  Here for foley catheter removal which has been done by the nurse a few minutes ago.   Was able to void post op in the PACU, but had residual volumes over 150 cc.   Patient complaining of abdominal bloating, chest tightness with cough. She states no BM since 12-18-15 but she is taking Miralax daily.  Not taking much Percocet. Took only 3 of them.  Not taking much Motrin.  No nausea or vomiting.  Feels like she is retaining fluid.   Some chest heaviness.  Some difficulty breathing.  Feels short of breath.  Not coughing up anything.  No fevers.   Temp: 98.1  GYNECOLOGIC HISTORY: Patient's last menstrual period was 08/16/2015 (exact date). Contraception:  Mirena IUD inserted 11-28-15 Menopausal hormone therapy:  n/a Last mammogram: 07/2015 normal per patient:[facility]. Cat D density. BI-RADS-1. Lifetime risk of breast CA is over 20% so I discussed MRI next year if desires.  Last pap smear:  11-16-15 Neg:Neg HR HPV          OB History    Gravida Para Term Preterm AB Living   2 2 2     2    SAB TAB Ectopic Multiple Live Births           2         There are no active problems to display for this patient.   Past Medical History:  Diagnosis Date  . Amenorrhea   . Anemia   . Anxiety   . Celiac disease   . Depression   . Heart murmur   . History of anorexia nervosa   . Urinary incontinence     Past Surgical History:  Procedure Laterality Date  . BLADDER SUSPENSION N/A 12/18/2015   Procedure: TRANSVAGINAL TAPE (TVT) PROCEDURE exact midurethral sling;  Surgeon: Patton SallesBrook E Amundson C Silva, MD;  Location: [facility];  Service:  Gynecology;  Laterality: N/A;  . CYSTOCELE REPAIR N/A 12/18/2015   Procedure: ANTERIOR REPAIR (CYSTOCELE);  Surgeon: Patton SallesBrook E Amundson C Silva, MD;  Location: [facility];  Service: Gynecology;  Laterality: N/A;  . CYSTOSCOPY N/A 12/18/2015   Procedure: CYSTOSCOPY;  Surgeon: Patton SallesBrook E Amundson C Silva, MD;  Location: [facility];  Service: Gynecology;  Laterality: N/A;  . OVARIAN CYST DRAINAGE Right     Current Outpatient Prescriptions  Medication Sig Dispense Refill  . buPROPion (WELLBUTRIN SR) 150 MG 12 hr tablet TAKE ONE TABLET (150 MG TOTAL) BY MOUTH 2 (TWO) TIMES DAILY.    Marland Kitchen. escitalopram (LEXAPRO) 20 MG tablet TAKE 1 TABLET BY MOUTH EVERY DAY    . ibuprofen (ADVIL,MOTRIN) 800 MG tablet Take 1 tablet (800 mg total) by mouth every 8 (eight) hours as needed. 30 tablet 1  . levonorgestrel (MIRENA) 20 MCG/24HR IUD by Intrauterine route.    . Multiple Vitamins-Minerals (MULTIVITAMIN PO) Take 1 tablet by mouth every morning.    Marland Kitchen. oxyCODONE-acetaminophen (PERCOCET) 5-325 MG tablet Take 2 tablets by mouth every 4 (four) hours as needed. use only as much as needed to relieve pain 30 tablet 0  . polyethylene glycol powder (GLYCOLAX/MIRALAX) powder Take 1 Container by mouth once.  No current facility-administered medications for this visit.      ALLERGIES: Patient has no known allergies.  Family History  Problem Relation Age of Onset  . Breast cancer Mother   . Diabetes Father   . Hypertension Father   . Dementia Father   . Stroke Maternal Grandfather   . Lymphoma Paternal Grandmother   . Heart attack Paternal Grandfather     Social History   Social History  . Marital status: Married    Spouse name: N/A  . Number of children: N/A  . Years of education: N/A   Occupational History  . Not on file.   Social History Main Topics  . Smoking status: Former Games developermoker  . Smokeless tobacco: Never Used  . Alcohol use 6.0 oz/week    10 Glasses of wine per week  . Drug use: No  . Sexual activity: Yes     Partners: Male    Birth control/ protection: IUD     Comment: Mirena inserted 11-28-15   Other Topics Concern  . Not on file   Social History Narrative  . No narrative on file    ROS:  Pertinent items are noted in HPI.  PHYSICAL EXAMINATION:    BP 110/72 (BP Location: Right Arm, Patient Position: Sitting, Cuff Size: Normal)   Pulse (!) 50   Temp 98.1 F (36.7 C) (Oral)   Ht 5' 3.25" (1.607 m)   Wt 121 lb (54.9 kg)   LMP 08/16/2015 (Exact Date)   BMI 21.27 kg/m     General appearance: alert, cooperative and appears stated age.NAD. Lungs: clear to auscultation bilaterally Heart: regular rate and rhythm Abdomen: soft, non-tender, no masses,  no organomegaly   Pelvic: External genitalia:  SP incision intact,  Minor ecchymoses.              Urethra:  normal appearing urethra with no masses, tenderness or lesions                     Bimanual Exam:  Uterus:  normal size, contour, position, consistency, mobility, non-tender.  Suture line intact and sling protected.              Adnexa: no mass, fullness, tenderness            Chaperone was present for exam.  ASSESSMENT  Status post anterior colporrhaphy and TVT Exact midurethral sling with cystoscopy.  Urinary retention.  Constipation.  Post op shortness of breath.   PLAN  Patient taught self cath.  She was successful with this and will do prn only.  Given cath tips. Take glycerin suppository and then Magnesium citrate if still constipated. Will refer patient to the ER for evaluation of SOB and for CT of chest to rule out PE.  Husband will take her there now.  We will follow but let the ER perform the evaluation.  Follow up for 6 week post op visit and prn.   An After Visit Summary was printed and given to the patient.

## 2015-12-22 NOTE — ED Notes (Signed)
Patient transported to X-ray 

## 2015-12-22 NOTE — ED Provider Notes (Signed)
[facility]Y DEPT Provider Note   CSN: [id number] Arrival date & time: 12/22/15  1016     History   Chief Complaint Chief Complaint  Patient presents with  . Shortness of Breath    post-op  . Chest Pain    HPI Michelle Ward is a 45 y.o. female.  HPI Patient's had shortness of breath and slight cough. 4 days ago she had a cystocele repair. States that since then she has had more shortness of breath. States it is worse walking up stairs. Occasional cough is nonproductive. No fevers. States she does feel "puffy". [facility]. Patient runs marathons. Has a resting heart rate that is in the 40s and 50s at baseline. No swelling of one leg. No chest pain. States her surgery went well.   Past Medical History:  Diagnosis Date  . Amenorrhea   . Anemia   . Anxiety   . Celiac disease   . Depression   . Heart murmur   . History of anorexia nervosa   . Urinary incontinence     There are no active problems to display for this patient.   Past Surgical History:  Procedure Laterality Date  . BLADDER SUSPENSION N/A 12/18/2015   Procedure: TRANSVAGINAL TAPE (TVT) PROCEDURE exact midurethral sling;  Surgeon: Patton Salles, MD;  Location: [facility];  Service: Gynecology;  Laterality: N/A;  . CYSTOCELE REPAIR N/A 12/18/2015   Procedure: ANTERIOR REPAIR (CYSTOCELE);  Surgeon: Patton Salles, MD;  Location: [facility];  Service: Gynecology;  Laterality: N/A;  . CYSTOSCOPY N/A 12/18/2015   Procedure: CYSTOSCOPY;  Surgeon: Patton Salles, MD;  Location: [facility];  Service: Gynecology;  Laterality: N/A;  . OVARIAN CYST DRAINAGE Right     OB History    Gravida Para Term Preterm AB Living   2 2 2     2    SAB TAB Ectopic Multiple Live Births           2       Home Medications    Prior to Admission medications   Medication Sig Start Date End Date Taking? Authorizing Provider  buPROPion (WELLBUTRIN SR) 150 MG 12 hr tablet  TAKE ONE TABLET (150 MG TOTAL) BY MOUTH 2 (TWO) TIMES DAILY. 04/28/15  Yes Historical Provider, MD  escitalopram (LEXAPRO) 20 MG tablet TAKE 1 TABLET BY MOUTH EVERY DAY 07/29/15  Yes Historical Provider, MD  ibuprofen (ADVIL,MOTRIN) 800 MG tablet Take 1 tablet (800 mg total) by mouth every 8 (eight) hours as needed. 12/07/15  Yes Brook E Ardell Isaacs, MD  Multiple Vitamins-Minerals (MULTIVITAMIN PO) Take 1 tablet by mouth every morning.   Yes Historical Provider, MD  oxyCODONE-acetaminophen (PERCOCET) 5-325 MG tablet Take 2 tablets by mouth every 4 (four) hours as needed. use only as much as needed to relieve pain Patient taking differently: Take 1 tablet by mouth every 4 (four) hours as needed for moderate pain.  12/07/15  Yes Brook E Ardell Isaacs, MD  polyethylene glycol powder (GLYCOLAX/MIRALAX) powder Take 1 Container by mouth once.   Yes Historical Provider, MD  levonorgestrel (MIRENA) 20 MCG/24HR IUD by Intrauterine route.    Historical Provider, MD    Family History Family History  Problem Relation Age of Onset  . Breast cancer Mother   . Diabetes Father   . Hypertension Father   . Dementia Father   . Stroke Maternal Grandfather   . Lymphoma Paternal  Grandmother   . Heart attack Paternal Grandfather     Social History Social History  Substance Use Topics  . Smoking status: Former Games developermoker  . Smokeless tobacco: Never Used  . Alcohol use 6.0 oz/week    10 Glasses of wine per week     Allergies   Patient has no known allergies.   Review of Systems Review of Systems  Constitutional: Negative for appetite change.  HENT: Negative for congestion.   Respiratory: Positive for cough and shortness of breath. Negative for chest tightness.   Cardiovascular: Negative for chest pain.  Gastrointestinal: Negative for abdominal pain, diarrhea, nausea and vomiting.  Genitourinary: Negative for dysuria.  Musculoskeletal: Negative for back pain.  Skin: Negative for rash.    Neurological: Negative for weakness and light-headedness.     Physical Exam Updated Vital Signs BP 156/87 (BP Location: Left Arm)   Pulse (!) 44   Temp 97.9 F (36.6 C) (Oral)   Resp 13   Ht 5\' 3"  (1.6 m)   Wt 120 lb (54.4 kg)   LMP 08/16/2015 (Exact Date)   SpO2 100%   BMI 21.26 kg/m   Physical Exam  Constitutional: She appears well-developed.  HENT:  Head: Atraumatic.  Eyes: EOM are normal.  Neck: Neck supple. No JVD present.  Cardiovascular:  Bradycardia  Pulmonary/Chest:  Mildly harsh breath sounds diffusely. No focal rales.  Abdominal: Soft. There is no tenderness.  Musculoskeletal: She exhibits no edema.  Neurological: She is alert.  Skin: Skin is warm.     ED Treatments / Results  Labs (all labs ordered are listed, but only abnormal results are displayed) Labs Reviewed  CBC - Abnormal; Notable for the following:       Result Value   RBC 3.85 (*)    Hemoglobin 11.9 (*)    HCT 34.9 (*)    All other components within normal limits  BASIC METABOLIC PANEL  I-STAT TROPOININ, ED    EKG  EKG Interpretation  Date/Time:  Friday December 22 2015 10:32:33 EST Ventricular Rate:  51 PR Interval:    QRS Duration: 96 QT Interval:  457 QTC Calculation: 421 R Axis:   72 Text Interpretation:  Sinus rhythm Confirmed by Rubin PayorPICKERING  MD, Harrold DonathNATHAN [phone]([id number]) on 12/22/2015 11:00:54 AM       Radiology Dg Chest 2 View  Result Date: 12/22/2015 CLINICAL DATA:  SOB that started this morning.Pt had bladder sling surgery just a few days ago Hx of ex smoker x many years agoHx of heart murmur EXAM: CHEST  2 VIEW COMPARISON:  None. FINDINGS: The heart size and mediastinal contours are within normal limits. Both lungs are clear. No pleural effusion or pneumothorax. The visualized skeletal structures are unremarkable. IMPRESSION: No active cardiopulmonary disease. Electronically Signed   By: Amie Portlandavid  Ormond M.D.   On: 12/22/2015 10:56   Ct Angio Chest Pe W And/or Wo  Contrast  Result Date: 12/22/2015 CLINICAL DATA:  Shortness of breath. EXAM: CT ANGIOGRAPHY CHEST WITH CONTRAST TECHNIQUE: Multidetector CT imaging of the chest was performed using the standard protocol during bolus administration of intravenous contrast. Multiplanar CT image reconstructions and MIPs were obtained to evaluate the vascular anatomy. CONTRAST:  100 mL of Isovue 370 intravenously. COMPARISON:  Radiographs of same day. FINDINGS: Cardiovascular: There is no evidence of thoracic aortic dissection or aneurysm. There is no definite evidence of pulmonary embolus. Mediastinum/Nodes: No mediastinal mass or adenopathy is noted. Lungs/Pleura: No pneumothorax or pleural effusion is noted. Mild bilateral posterior basilar subsegmental atelectasis is  noted. Upper Abdomen: No definite abnormality seen. Musculoskeletal: No significant osseous abnormality is noted. Review of the MIP images confirms the above findings. IMPRESSION: No definite evidence of pulmonary embolus. Mild bilateral posterior basilar subsegmental atelectasis. Electronically Signed   By: Lupita RaiderJames  Green Jr, M.D.   On: 12/22/2015 12:56    Procedures Procedures (including critical care time)  Medications Ordered in ED Medications  albuterol (PROVENTIL) (2.5 MG/3ML) 0.083% nebulizer solution 2.5 mg (2.5 mg Nebulization Given 12/22/15 1121)  iopamidol (ISOVUE-370) 76 % injection (100 mLs  Contrast Given 12/22/15 1225)     Initial Impression / Assessment and Plan / ED Course  I have reviewed the triage vital signs and the nursing notes.  Pertinent labs & imaging results that were available during my care of the patient were reviewed by me and considered in my medical decision making (see chart for details).  Clinical Course     Patient presents for shortness of breath after surgery. Labs reassuring. CT angiography done and does not show clot. Did have some wheezing and breathing treatment done with some mild improvement of symptoms.  Will discharge home.  Final Clinical Impressions(s) / ED Diagnoses   Final diagnoses:  Dyspnea on exertion    New Prescriptions New Prescriptions   No medications on file     Benjiman CoreNathan Beila Purdie, MD 12/22/15 1305

## 2015-12-22 NOTE — Progress Notes (Signed)
Verified patient's name and DOB. Catheter removed per Dr. Edward JollySilva. Patient tolerated well. Approximately 100 cc's of clear, yellow urine noted in leg bag.

## 2015-12-25 ENCOUNTER — Ambulatory Visit: Payer: Managed Care, Other (non HMO) | Admitting: Obstetrics and Gynecology

## 2015-12-28 ENCOUNTER — Telehealth: Payer: Self-pay

## 2015-12-28 NOTE — Telephone Encounter (Signed)
Left message to call Kaitlyn at [phone].  Dr.Silva reviewed the patient's screening mammogram report from 06/14/2015 with [facility] Need to discuss with patient having bilateral breast MRI next year if she desires. Patient has greater than 20% lifetime risk of breast cancer, adjunct imaging with MRI should be considered.

## 2016-01-01 NOTE — Telephone Encounter (Signed)
Spoke with patient. Advised of message as seen below regarding recommendations from Dr.Silva to have a bilateral breast MRI following next screening mammogram. Patient is agreeable and would like to proceed. The patient will contact [facility] at her convenience to schedule her screening mammogram in May 2018. Aware bilateral breast MRI is performed within 3 months of her screening mammogram. Patient is agreeable. Screening mammogram reports from 06/14/2015 sent to scan.  Routing to provider for final review. Patient agreeable to disposition. Will close encounter.

## 2016-01-17 ENCOUNTER — Encounter: Payer: Self-pay | Admitting: Obstetrics and Gynecology

## 2016-01-17 ENCOUNTER — Ambulatory Visit (INDEPENDENT_AMBULATORY_CARE_PROVIDER_SITE_OTHER): Payer: Managed Care, Other (non HMO) | Admitting: Obstetrics and Gynecology

## 2016-01-17 VITALS — BP 110/70 | HR 56 | Ht 63.25 in | Wt 120.0 lb

## 2016-01-17 DIAGNOSIS — R300 Dysuria: Secondary | ICD-10-CM

## 2016-01-17 DIAGNOSIS — Z9889 Other specified postprocedural states: Secondary | ICD-10-CM

## 2016-01-17 DIAGNOSIS — R0789 Other chest pain: Secondary | ICD-10-CM

## 2016-01-17 LAB — POCT URINALYSIS DIPSTICK
Bilirubin, UA: NEGATIVE
Glucose, UA: NEGATIVE
Ketones, UA: NEGATIVE
Nitrite, UA: NEGATIVE
PH UA: 5
PROTEIN UA: NEGATIVE
UROBILINOGEN UA: NEGATIVE

## 2016-01-17 MED ORDER — CIPROFLOXACIN HCL 500 MG PO TABS: 500.0000 mg | ORAL_TABLET | Freq: Two times a day (BID) | ORAL | 0 refills | Status: DC

## 2016-01-17 NOTE — Patient Instructions (Signed)
Take AZO standard for bladder pain.  You can take one or two three times a day as needed for pain.  This will turn your urine blood red, fluorescent.

## 2016-01-17 NOTE — Addendum Note (Signed)
Addended by: Alphonsa OverallIXON, Isyss Espinal L on: 01/17/2016 10:36 AM   Modules accepted: Orders

## 2016-01-17 NOTE — Progress Notes (Signed)
[facility] Angelita Inglesamika Howell. Patient scheduled for today, 01/17/16, at 1:30pm with Azzie RoupShane Anderson, FNP. Patient agreeable to date and time.

## 2016-01-17 NOTE — Progress Notes (Signed)
GYNECOLOGY  VISIT   HPI: 45 y.o.   Married  Caucasian  female   G2P2002 with Patient's last menstrual period was 01/14/2016 (exact date).   here for 4 weeks follow up ANTERIOR REPAIR (CYSTOCELE) (N/A Vagina ) TRANSVAGINAL TAPE (TVT) PROCEDURE exact midurethral sling (N/A Vagina )CYSTOSCOPY (N/A Bladder)  Still with achiness in chest.  Feels tight.  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 No significant productive cough.   Had CT to rule out PE at last office visit.   Some pain or burning with urination.  Not self cathing.  No leakage of urine.   PCP - Dr. Angelita Inglesamika Howell - [facility].   GYNECOLOGIC HISTORY: Patient's last menstrual period was 01/14/2016 (exact date). Contraception:  Mirena IUD inserted 11-28-15 Menopausal hormone therapy:  n/a Last mammogram: 07/2015 normal per patient:[facility]. Cat D density. BI-RADS-1. Lifetime risk of breast CA is over 20% so I discussed MRI next year if desires.   Last pap smear:   11-16-15 Neg:Neg HR HPV          OB History    Gravida Para Term Preterm AB Living   2 2 2     2    SAB TAB Ectopic Multiple Live Births           2         There are no active problems to display for this patient.   Past Medical History:  Diagnosis Date  . Amenorrhea   . Anemia   . Anxiety   . Celiac disease   . Depression   . Heart murmur   . History of anorexia nervosa   . Urinary incontinence     Past Surgical History:  Procedure Laterality Date  . BLADDER SUSPENSION N/A 12/18/2015   Procedure: TRANSVAGINAL TAPE (TVT) PROCEDURE exact midurethral sling;  Surgeon: Patton SallesBrook E Amundson C Silva, MD;  Location: [facility];  Service: Gynecology;  Laterality: N/A;  . CYSTOCELE REPAIR N/A 12/18/2015   Procedure: ANTERIOR REPAIR (CYSTOCELE);  Surgeon: Patton SallesBrook E Amundson C Silva, MD;  Location: [facility];  Service: Gynecology;  Laterality: N/A;  . CYSTOSCOPY N/A 12/18/2015   Procedure: CYSTOSCOPY;  Surgeon: Patton SallesBrook E Amundson C Silva, MD;  Location: [facility];  Service:  Gynecology;  Laterality: N/A;  . OVARIAN CYST DRAINAGE Right     Current Outpatient Prescriptions  Medication Sig Dispense Refill  . buPROPion (WELLBUTRIN SR) 150 MG 12 hr tablet TAKE ONE TABLET (150 MG TOTAL) BY MOUTH 2 (TWO) TIMES DAILY.    Marland Kitchen. escitalopram (LEXAPRO) 20 MG tablet TAKE 1 TABLET BY MOUTH EVERY DAY    . levonorgestrel (MIRENA) 20 MCG/24HR IUD by Intrauterine route.    . Multiple Vitamins-Minerals (MULTIVITAMIN PO) Take 1 tablet by mouth every morning.    . polyethylene glycol powder (GLYCOLAX/MIRALAX) powder Take 1 Container by mouth once.     No current facility-administered medications for this visit.      ALLERGIES: Patient has no known allergies.  Family History  Problem Relation Age of Onset  . Breast cancer Mother   . Diabetes Father   . Hypertension Father   . Dementia Father   . Stroke Maternal Grandfather   . Lymphoma Paternal Grandmother   . Heart attack Paternal Grandfather     Social History   Social History  . Marital status: Married    Spouse name: N/A  . Number of children: N/A  . Years of education: N/A   Occupational History  . Not on file.   Social  History Main Topics  . Smoking status: Former Games developermoker  . Smokeless tobacco: Never Used  . Alcohol use 6.0 oz/week    10 Glasses of wine per week  . Drug use: No  . Sexual activity: Yes    Partners: Male    Birth control/ protection: IUD     Comment: Mirena inserted 11-28-15   Other Topics Concern  . Not on file   Social History Narrative  . No narrative on file    ROS:  Pertinent items are noted in HPI.  PHYSICAL EXAMINATION:    BP 110/70 (BP Location: Right Arm, Patient Position: Sitting, Cuff Size: Normal)   Pulse (!) 56   Ht 5' 3.25" (1.607 m)   Wt 120 lb (54.4 kg)   LMP 01/14/2016 (Exact Date)   BMI 21.09 kg/m     General appearance: alert, cooperative and appears stated age.  NAD. Lungs - CTA bilaterally.  Cor - S1 S2 bradycardia.  Abdomen: SP incisions intact, soft,  non-tender, no masses,  no organomegaly  Pelvic: External genitalia:  no lesions              Urethra:  normal appearing urethra with no masses, tenderness or lesions              Bartholins and Skenes: normal                 Vagina: normal appearing vagina with normal color and discharge, no lesions.  Sutures present.  Sling protected.               Cervix: no lesions                Bimanual Exam:  Uterus:  normal size, contour, position, consistency, mobility, non-tender              Adnexa: no mass, fullness, tenderness       Chaperone was present for exam.  ASSESSMENT  Status post TVT/cysto/anterior colporrhaphy.  Void well now.  Dysuria.  Chest aching.  Ruled out PE. Hx exercise induced asthma.  PLAN  Urine dip now:  Trace WBCs and RBCs.   Will send for micro and culture.  Start Cipro 500 mg po bid for one week. Discussed AZO standard 100 - 200 mg po tid prn.  Our office will help make an appointment for patient to see her PCP for chest symptoms.  Follow up here in 3 weeks for final post op check.  An After Visit Summary was printed and given to the patient.

## 2016-01-18 LAB — URINALYSIS, MICROSCOPIC ONLY
Casts: NONE SEEN [LPF]
Crystals: NONE SEEN [HPF]
Squamous Epithelial / LPF: NONE SEEN [HPF] (ref <=5)
Yeast: NONE SEEN [HPF]

## 2016-01-19 ENCOUNTER — Telehealth: Payer: Self-pay

## 2016-01-19 LAB — URINE CULTURE

## 2016-01-19 NOTE — Telephone Encounter (Signed)
Left message to call Kaitlyn at [phone]. 

## 2016-01-19 NOTE — Telephone Encounter (Signed)
-----   Message from Patton SallesBrook E Amundson C Silva, MD sent at 01/19/2016  6:39 AM EST ----- Results to patient through My Chart. Please contact patient in follow up to UC done.  Sensitivities are still pending. I will need to switch her to Ampicillin 500 mg po q 6 hours x 7 days if she is not better on the Cipro.   Hello Sarit,   The urine culture is showing bacterial infection with Enterococcus.  The antibiotic sensitivities are still pending and may delay a little further to have this final result.  I will ask the nurse to call and see how you are doing.  If you are not feeling better, I will switch your antibiotic.   Thanks,   Conley SimmondsBrook Silva, MD  Cc- Claudette LawsAmanda Dixon

## 2016-01-22 MED ORDER — AMPICILLIN 500 MG PO CAPS: 500.0000 mg | ORAL_CAPSULE | Freq: Four times a day (QID) | ORAL | 0 refills | Status: DC

## 2016-01-22 NOTE — Telephone Encounter (Signed)
Left message to call Makara Lanzo at [phone]. 

## 2016-01-22 NOTE — Telephone Encounter (Signed)
-----   Message from Patton SallesBrook E Amundson C Silva, MD sent at 01/21/2016  7:10 PM EST ----- Results to patient through My Chart.  Hello Michelle Ward,   The final test results are back, and the urine contained a bacterial organism called enterococcus.  Antibiotic testing was performed with Levofloxacin, which is a related to Ciprofloxacin.  Please let me know if your bladder is not feeling better.  If you are not improved, I will switch your antibiotic to Ampicillin.   Thanks,   Conley SimmondsBrook Silva, MD  Cc- Claudette LawsAmanda Dixon

## 2016-01-22 NOTE — Telephone Encounter (Signed)
Spoke with patient. Advised of message and results as seen below from Dr.Silva. Patient reports she is till having burning with urination while taking Cipro. Advised will need to discontinue Cipro and start taking Ampicillin 500 mg po q 6 hours x7 days. Patient is agreeable and verbalizes understanding. Will return call if symptoms persist with taking Ampicillin.  Routing to provider for final review. Patient agreeable to disposition. Will close encounter.

## 2016-02-07 ENCOUNTER — Ambulatory Visit: Payer: Managed Care, Other (non HMO) | Admitting: Obstetrics and Gynecology

## 2016-03-20 DIAGNOSIS — F331 Major depressive disorder, recurrent, moderate: Secondary | ICD-10-CM | POA: Insufficient documentation

## 2016-03-20 DIAGNOSIS — G4700 Insomnia, unspecified: Secondary | ICD-10-CM | POA: Insufficient documentation

## 2017-07-21 ENCOUNTER — Ambulatory Visit (INDEPENDENT_AMBULATORY_CARE_PROVIDER_SITE_OTHER): Payer: BLUE CROSS/BLUE SHIELD | Admitting: Sports Medicine

## 2017-07-21 ENCOUNTER — Encounter: Payer: Self-pay | Admitting: Sports Medicine

## 2017-07-21 VITALS — BP 131/83 | HR 45 | Ht 63.0 in | Wt 115.0 lb

## 2017-07-21 DIAGNOSIS — M7662 Achilles tendinitis, left leg: Secondary | ICD-10-CM

## 2017-07-21 DIAGNOSIS — M7661 Achilles tendinitis, right leg: Secondary | ICD-10-CM | POA: Diagnosis not present

## 2017-07-21 NOTE — Progress Notes (Signed)
   Subjective:    Patient ID: Michelle Ward, female    DOB: 08/11/1970, 47 y.o.   MRN: [id number]  HPI chief complaint: Bilateral Achilles pain  47 year old runner comes in today complaining of 6 months of bilateral Achilles pain. Symptoms began after she increased her mileage [place] [facility] [place] [facility] [place]. She describes pain and tightness in the Achilles tendon worse first thing in the morning as well as at the beginning of her run.Over the past few weeks her pain has intensified to the point that she has had to start cross-training on an elliptical. She has been doing some calf stretches but they have not been helping. She did have similar pain in her Achilles several years ago which resolved on its own. She averages about 50-60 miles a week running 6 days a week.  Past medical history reviewed Medications reviewed Allergies reviewed    Review of Systems As above    Objective:   Physical Exam  Well-developed, well-nourished. No acute distress. Awake alert and oriented 3. Vital signs reviewed  Examination of each Achilles tendon shows tenderness to palpation at the mid substance of the Achilles. No significant swelling seen. Bilateral heel cord tightness.Neutral arches. Pronation with running. No limp.  Bedside MSK ultrasound of the right Achilles tendon shows thickening consistent with Achilles tendinopathy but no tearing      Assessment & Plan:   Bilateral Achilles tendinopathy  Green sports insoles with scaphoid pads to help correct pronation. She will start daily eccentric heel drop exercises and follow-up with me in 6 weeks. She may continue to run but I recommended that she decrease her mileage and run every other day. She may cross train on the other days. Call with questions or concerns prior to her follow-up visit.

## 2017-09-01 ENCOUNTER — Ambulatory Visit: Payer: BLUE CROSS/BLUE SHIELD | Admitting: Sports Medicine

## 2018-08-31 ENCOUNTER — Other Ambulatory Visit: Payer: Self-pay

## 2018-09-01 NOTE — Progress Notes (Signed)
48 y.o. G41P2002 Divorced White or Caucasian Not Hispanic or Latino female here for annual exam.  She has a mirena IUD, placed in 10/17, no cycles. Not sexually active.  H/O TVT and cystocele repair in 11/17. No leakage.     No LMP recorded. (Menstrual status: IUD).          Sexually active: No.  The current method of family planning is IUD.    Exercising: Yes.    running, weights Smoker:  no  Health Maintenance: Pap: 11/16/2015 WNL NEG HPV History of abnormal Pap:  no MMG:  06/14/2015 Birads 1 negative Colonoscopy:  Never BMD:   02/2015 WNL per patient  TDaP:  Unsure Gardasil: N/A    reports that she has quit smoking. She has never used smokeless tobacco. She reports current alcohol use of about 2.0 standard drinks of alcohol per week. 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 Ex-Husband is building homes (has his own company), co-parent well together. Son is 34, daughter is 110.   Past Medical History:  Diagnosis Date  . Amenorrhea   . Anemia   . Anxiety   . Celiac disease   . Depression   . Heart murmur   . History of anorexia nervosa   . Urinary incontinence     Past Surgical History:  Procedure Laterality Date  . BLADDER SUSPENSION N/A 12/18/2015   Procedure: TRANSVAGINAL TAPE (TVT) PROCEDURE exact midurethral sling;  Surgeon: Nunzio Cobbs, MD;  Location: [facility];  Service: Gynecology;  Laterality: N/A;  . CYSTOCELE REPAIR N/A 12/18/2015   Procedure: ANTERIOR REPAIR (CYSTOCELE);  Surgeon: Nunzio Cobbs, MD;  Location: [facility];  Service: Gynecology;  Laterality: N/A;  . CYSTOSCOPY N/A 12/18/2015   Procedure: CYSTOSCOPY;  Surgeon: Nunzio Cobbs, MD;  Location: [facility];  Service: Gynecology;  Laterality: N/A;  . OVARIAN CYST DRAINAGE Right     Current Outpatient Medications  Medication Sig Dispense Refill  . buPROPion (WELLBUTRIN SR) 150 MG 12 hr tablet TAKE ONE TABLET (150 MG  TOTAL) BY MOUTH 2 (TWO) TIMES DAILY.    Marland Kitchen escitalopram (LEXAPRO) 20 MG tablet TAKE 1 TABLET BY MOUTH EVERY DAY    . levonorgestrel (MIRENA) 20 MCG/24HR IUD by Intrauterine route.    . Multiple Vitamins-Minerals (MULTIVITAMIN PO) Take 1 tablet by mouth every morning.     No current facility-administered medications for this visit.     Family History  Problem Relation Age of Onset  . Breast cancer Mother   . Diabetes Father   . Hypertension Father   . Dementia Father   . Stroke Maternal Grandfather   . Lymphoma Paternal Grandmother   . Heart attack Paternal Grandfather     Review of Systems  Constitutional: Negative.   HENT: Negative.   Eyes: Negative.   Respiratory: Negative.   Cardiovascular: Negative.   Gastrointestinal: Negative.   Endocrine: Negative.   Genitourinary: Negative.   Musculoskeletal: Negative.   Skin: Negative.   Allergic/Immunologic: Negative.   Neurological: Negative.   Hematological: Negative.   Psychiatric/Behavioral: Negative.     Exam:   BP 110/76 (BP Location: Right Arm, Patient Position: Sitting, Cuff Size: Normal)   Pulse 60   Temp 98.1 F (36.7 C) (Skin)   Ht 5\' 3"  (1.6 m)   Wt 120 lb 3.2 oz (54.5 kg)   BMI 21.29 kg/m   Weight change: @WEIGHTCHANGE @ Height:   Height:  5\' 3"  (160 cm)  Ht Readings from Last 3 Encounters:  09/02/18 5\' 3"  (1.6 m)  07/21/17 5\' 3"  (1.6 m)  01/17/16 5' 3.25" (1.607 m)    General appearance: alert, cooperative and appears stated age Head: Normocephalic, without obvious abnormality, atraumatic Neck: no adenopathy, supple, symmetrical, trachea midline and thyroid normal to inspection and palpation Lungs: clear to auscultation bilaterally Cardiovascular: regular rate and rhythm Breasts: normal appearance, no masses or tenderness Abdomen: soft, non-tender; non distended,  no masses,  no organomegaly Extremities: extremities normal, atraumatic, no cyanosis or edema Skin: Skin color, texture, turgor normal. No  rashes or lesions Lymph nodes: Cervical, supraclavicular, and axillary nodes normal. No abnormal inguinal nodes palpated Neurologic: Grossly normal   Pelvic: External genitalia:  no lesions              Urethra:  normal appearing urethra with no masses, tenderness or lesions              Bartholins and Skenes: normal                 Vagina: normal appearing vagina with normal color and discharge, no lesions              Cervix: no lesions and IUD string 4 cm               Bimanual Exam:  Uterus:  normal size, contour, position, consistency, mobility, non-tender              Adnexa: no mass, fullness, tenderness               Rectovaginal: Confirms               Anus:  normal sphincter tone, no lesions  Chaperone was present for exam.  A:  Well Woman with normal exam  IUD check  P:   Pap with hpv  Cologuard ordered (she will check on coverage)  Discussed breast self exam  Discussed calcium and vit D intake  Labs with primary  When she becomes sexually active, she should use condoms.  Addendum: after the visit the patient c/o occasional pain in her RLQ that feels just like the pain she had with an ovarian cyst. The pain will last for up to a day, no current pain. Normal exam. She thinks she had the right ovarian cyst drained when she was pregnant. Her right ovary wasn't identified at her last ultrasound, I explained that didn't mean she didn't have a right ovary. If she has recurrent pain will set her up for an ultrasound.

## 2018-09-02 ENCOUNTER — Other Ambulatory Visit: Payer: Self-pay

## 2018-09-02 ENCOUNTER — Encounter: Payer: Self-pay | Admitting: Obstetrics and Gynecology

## 2018-09-02 ENCOUNTER — Other Ambulatory Visit (HOSPITAL_COMMUNITY)
Admission: RE | Admit: 2018-09-02 | Discharge: 2018-09-02 | Disposition: A | Discharge status: Home / Self Care | Payer: BC Managed Care – PPO | Source: Ambulatory Visit | Attending: Obstetrics and Gynecology | Admitting: Obstetrics and Gynecology

## 2018-09-02 ENCOUNTER — Ambulatory Visit: Payer: BC Managed Care – PPO | Admitting: Obstetrics and Gynecology

## 2018-09-02 VITALS — BP 110/76 | HR 60 | Temp 98.1°F | Ht 63.0 in | Wt 120.2 lb

## 2018-09-02 DIAGNOSIS — Z124 Encounter for screening for malignant neoplasm of cervix: Secondary | ICD-10-CM | POA: Diagnosis not present

## 2018-09-02 DIAGNOSIS — Z30431 Encounter for routine checking of intrauterine contraceptive device: Secondary | ICD-10-CM

## 2018-09-02 DIAGNOSIS — Z01419 Encounter for gynecological examination (general) (routine) without abnormal findings: Secondary | ICD-10-CM | POA: Diagnosis not present

## 2018-09-02 DIAGNOSIS — Z1211 Encounter for screening for malignant neoplasm of colon: Secondary | ICD-10-CM | POA: Diagnosis not present

## 2018-09-02 NOTE — Patient Instructions (Signed)
Check on coverage for cologuard.  EXERCISE AND DIET:  We recommended that you start or continue a regular exercise program for good health. Regular exercise means any activity that makes your heart beat faster and makes you sweat.  We recommend exercising at least 30 minutes per day at least 3 days a week, preferably 4 or 5.  We also recommend a diet low in fat and sugar.  Inactivity, poor dietary choices and obesity can cause diabetes, heart attack, stroke, and kidney damage, among others.    ALCOHOL AND SMOKING:  Women should limit their alcohol intake to no more than 7 drinks/beers/glasses of wine (combined, not each!) per week. Moderation of alcohol intake to this level decreases your risk of breast cancer and liver damage. And of course, no recreational drugs are part of a healthy lifestyle.  And absolutely no smoking or even second hand smoke. Most people know smoking can cause heart and lung diseases, but did you know it also contributes to weakening of your bones? Aging of your skin?  Yellowing of your teeth and nails?  CALCIUM AND VITAMIN D:  Adequate intake of calcium and Vitamin D are recommended.  The recommendations for exact amounts of these supplements seem to change often, but generally speaking 1,000 mg of calcium (between diet and supplement) and 800 units of Vitamin D per day seems prudent. Certain women may benefit from higher intake of Vitamin D.  If you are among these women, your doctor will have told you during your visit.    PAP SMEARS:  Pap smears, to check for cervical cancer or precancers,  have traditionally been done yearly, although recent scientific advances have shown that most women can have pap smears less often.  However, every woman still should have a physical exam from her gynecologist every year. It will include a breast check, inspection of the vulva and vagina to check for abnormal growths or skin changes, a visual exam of the cervix, and then an exam to evaluate the  size and shape of the uterus and ovaries.  And after 47 years of age, a rectal exam is indicated to check for rectal cancers. We will also provide age appropriate advice regarding health maintenance, like when you should have certain vaccines, screening for sexually transmitted diseases, bone density testing, colonoscopy, mammograms, etc.   MAMMOGRAMS:  All women over 70 years old should have a yearly mammogram. Many facilities now offer a "3D" mammogram, which may cost around $50 extra out of pocket. If possible,  we recommend you accept the option to have the 3D mammogram performed.  It both reduces the number of women who will be called back for extra views which then turn out to be normal, and it is better than the routine mammogram at detecting truly abnormal areas.    COLON CANCER SCREENING: Now recommend starting at age 25. At this time colonoscopy is not covered for routine screening until 50. There are take home tests that can be done between 45-49.   COLONOSCOPY:  Colonoscopy to screen for colon cancer is recommended for all women at age 60.  We know, you hate the idea of the prep.  We agree, BUT, having colon cancer and not knowing it is worse!!  Colon cancer so often starts as a polyp that can be seen and removed at colonscopy, which can quite literally save your life!  And if your first colonoscopy is normal and you have no family history of colon cancer, most women don't  have to have it again for 10 years.  Once every ten years, you can do something that may end up saving your life, right?  We will be happy to help you get it scheduled when you are ready.  Be sure to check your insurance coverage so you understand how much it will cost.  It may be covered as a preventative service at no cost, but you should check your particular policy.      Breast Self-Awareness Breast self-awareness means being familiar with how your breasts look and feel. It involves checking your breasts regularly and  reporting any changes to your health care provider. Practicing breast self-awareness is important. A change in your breasts can be a sign of a serious medical problem. Being familiar with how your breasts look and feel allows you to find any problems early, when treatment is more likely to be successful. All women should practice breast self-awareness, including women who have had breast implants. How to do a breast self-exam One way to learn what is normal for your breasts and whether your breasts are changing is to do a breast self-exam. To do a breast self-exam: Look for Changes  1. Remove all the clothing above your waist. 2. Stand in front of a mirror in a room with good lighting. 3. Put your hands on your hips. 4. Push your hands firmly downward. 5. Compare your breasts in the mirror. Look for differences between them (asymmetry), such as: ? Differences in shape. ? Differences in size. ? Puckers, dips, and bumps in one breast and not the other. 6. Look at each breast for changes in your skin, such as: ? Redness. ? Scaly areas. 7. Look for changes in your nipples, such as: ? Discharge. ? Bleeding. ? Dimpling. ? Redness. ? A change in position. Feel for Changes Carefully feel your breasts for lumps and changes. It is best to do this while lying on your back on the floor and again while sitting or standing in the shower or tub with soapy water on your skin. Feel each breast in the following way:  Place the arm on the side of the breast you are examining above your head.  Feel your breast with the other hand.  Start in the nipple area and make  inch (2 cm) overlapping circles to feel your breast. Use the pads of your three middle fingers to do this. Apply light pressure, then medium pressure, then firm pressure. The light pressure will allow you to feel the tissue closest to the skin. The medium pressure will allow you to feel the tissue that is a little deeper. The firm pressure  will allow you to feel the tissue close to the ribs.  Continue the overlapping circles, moving downward over the breast until you feel your ribs below your breast.  Move one finger-width toward the center of the body. Continue to use the  inch (2 cm) overlapping circles to feel your breast as you move slowly up toward your collarbone.  Continue the up and down exam using all three pressures until you reach your armpit.  Write Down What You Find  Write down what is normal for each breast and any changes that you find. Keep a written record with breast changes or normal findings for each breast. By writing this information down, you do not need to depend only on memory for size, tenderness, or location. Write down where you are in your menstrual cycle, if you are still menstruating. If  you are having trouble noticing differences in your breasts, do not get discouraged. With time you will become more familiar with the variations in your breasts and more comfortable with the exam. How often should I examine my breasts? Examine your breasts every month. If you are breastfeeding, the best time to examine your breasts is after a feeding or after using a breast pump. If you menstruate, the best time to examine your breasts is 5-7 days after your period is over. During your period, your breasts are lumpier, and it may be more difficult to notice changes. When should I see my health care provider? See your health care provider if you notice:  A change in shape or size of your breasts or nipples.  A change in the skin of your breast or nipples, such as a reddened or scaly area.  Unusual discharge from your nipples.  A lump or thick area that was not there before.  Pain in your breasts.  Anything that concerns you.

## 2018-09-03 LAB — CYTOLOGY - PAP
Diagnosis: NEGATIVE
HPV: NOT DETECTED

## 2018-09-23 ENCOUNTER — Encounter: Payer: Self-pay | Admitting: Obstetrics and Gynecology

## 2019-02-26 ENCOUNTER — Other Ambulatory Visit: Payer: BC Managed Care – PPO

## 2019-03-01 ENCOUNTER — Other Ambulatory Visit: Payer: BC Managed Care – PPO

## 2019-04-08 ENCOUNTER — Ambulatory Visit: Payer: BC Managed Care – PPO | Attending: Family

## 2019-04-08 DIAGNOSIS — Z23 Encounter for immunization: Secondary | ICD-10-CM

## 2019-04-08 NOTE — Progress Notes (Signed)
   Covid-19 Vaccination Clinic  Name:  Michelle Ward    MRN: [id number] DOB: 04-22-1970  04/08/2019  Michelle Ward was observed post Covid-19 immunization for 15 minutes without incidence. She was provided with Vaccine Information Sheet and instruction to access the V-Safe system.   Michelle Ward was instructed to call 911 with any severe reactions post vaccine: Marland Kitchen Difficulty breathing  . Swelling of your face and throat  . A fast heartbeat  . A bad rash all over your body  . Dizziness and weakness    Immunizations Administered    Name Date Dose VIS Date Route   Moderna COVID-19 Vaccine 04/08/2019  3:15 PM 0.5 mL 01/12/2019 Intramuscular   Manufacturer: Moderna   Lot: [id number]   NDC: 76160-737-10

## 2019-05-11 ENCOUNTER — Ambulatory Visit: Payer: BC Managed Care – PPO | Attending: Family

## 2019-05-11 DIAGNOSIS — Z23 Encounter for immunization: Secondary | ICD-10-CM

## 2019-05-11 NOTE — Progress Notes (Signed)
   Covid-19 Vaccination Clinic  Name:  Michelle Ward    MRN: [id number] DOB: May 09, 1970  05/11/2019  [name] was observed post Covid-19 immunization for 15 minutes without incident. She was provided with Vaccine Information Sheet and instruction to access the V-Safe system.   [name] was instructed to call 911 with any severe reactions post vaccine: Marland Kitchen Difficulty breathing  . Swelling of face and throat  . A fast heartbeat  . A bad rash all over body  . Dizziness and weakness   Immunizations Administered    Name Date Dose VIS Date Route   Moderna COVID-19 Vaccine 05/11/2019  3:09 PM 0.5 mL 01/12/2019 Intramuscular   Manufacturer: Moderna   Lot: [id number]   NDC: 95583-167-42

## 2019-09-06 NOTE — Progress Notes (Deleted)
49 y.o. G60P2002 Divorced White or Caucasian Not Hispanic or Latino female here for annual exam.      No LMP recorded. (Menstrual status: IUD).          Sexually active: {yes no:314532}  The current method of family planning is {contraception:315051}.    Exercising: {yes no:314532}  {types:19826} Smoker:  {YES NO:22349}  Health Maintenance: Pap:09/02/18 WNL 11/16/2015 WNL NEG HPV  History of abnormal Pap:  no MMG:  09/23/18 Density D Bi-rads 1 Neg  BMD:   02/2015 WNL per patient Colonoscopy: Never  TDaP:  03/02/17 Gardasil: NA   reports that she has quit smoking. She has never used smokeless tobacco. She reports current alcohol use of about 2.0 standard drinks of alcohol per week. She reports that she does not use drugs.  Past Medical History:  Diagnosis Date  . Amenorrhea   . Anemia   . Anxiety   . Celiac disease   . Depression   . Heart murmur   . History of anorexia nervosa   . Urinary incontinence     Past Surgical History:  Procedure Laterality Date  . BLADDER SUSPENSION N/A 12/18/2015   Procedure: TRANSVAGINAL TAPE (TVT) PROCEDURE exact midurethral sling;  Surgeon: Patton Salles, MD;  Location: [facility];  Service: Gynecology;  Laterality: N/A;  . CYSTOCELE REPAIR N/A 12/18/2015   Procedure: ANTERIOR REPAIR (CYSTOCELE);  Surgeon: Patton Salles, MD;  Location: [facility];  Service: Gynecology;  Laterality: N/A;  . CYSTOSCOPY N/A 12/18/2015   Procedure: CYSTOSCOPY;  Surgeon: Patton Salles, MD;  Location: [facility];  Service: Gynecology;  Laterality: N/A;  . OVARIAN CYST DRAINAGE Right     Current Outpatient Medications  Medication Sig Dispense Refill  . buPROPion (WELLBUTRIN SR) 150 MG 12 hr tablet TAKE ONE TABLET (150 MG TOTAL) BY MOUTH 2 (TWO) TIMES DAILY.    Marland Kitchen escitalopram (LEXAPRO) 20 MG tablet TAKE 1 TABLET BY MOUTH EVERY DAY    . levonorgestrel (MIRENA) 20 MCG/24HR IUD by Intrauterine route.    . Multiple Vitamins-Minerals (MULTIVITAMIN PO)  Take 1 tablet by mouth every morning.     No current facility-administered medications for this visit.    Family History  Problem Relation Age of Onset  . Breast cancer Mother   . Diabetes Father   . Hypertension Father   . Dementia Father   . Stroke Maternal Grandfather   . Lymphoma Paternal Grandmother   . Heart attack Paternal Grandfather     Review of Systems  Exam:   There were no vitals taken for this visit.  Weight change: @WEIGHTCHANGE @ Height:      Ht Readings from Last 3 Encounters:  09/02/18 5\' 3"  (1.6 m)  07/21/17 5\' 3"  (1.6 m)  01/17/16 5' 3.25" (1.607 m)    General appearance: alert, cooperative and appears stated age Head: Normocephalic, without obvious abnormality, atraumatic Neck: no adenopathy, supple, symmetrical, trachea midline and thyroid {CHL AMB PHY EX THYROID NORM DEFAULT:[phone]::"normal to inspection and palpation"} Lungs: clear to auscultation bilaterally Cardiovascular: regular rate and rhythm Breasts: {Exam; breast:13139::"normal appearance, no masses or tenderness"} Abdomen: soft, non-tender; non distended,  no masses,  no organomegaly Extremities: extremities normal, atraumatic, no cyanosis or edema Skin: Skin color, texture, turgor normal. No rashes or lesions Lymph nodes: Cervical, supraclavicular, and axillary nodes normal. No abnormal inguinal nodes palpated Neurologic: Grossly normal   Pelvic: External genitalia:  no lesions  Urethra:  normal appearing urethra with no masses, tenderness or lesions              Bartholins and Skenes: normal                 Vagina: normal appearing vagina with normal color and discharge, no lesions              Cervix: {CHL AMB PHY EX CERVIX NORM [phone]::"no lesions"}               Bimanual Exam:  Uterus:  {CHL AMB PHY EX UTERUS NORM [phone]::"normal size, contour, position, consistency, mobility, non-tender"}              Adnexa: {CHL AMB PHY EX ADNEXA NO MASS  [phone]::"no mass, fullness, tenderness"}               Rectovaginal: Confirms               Anus:  normal sphincter tone, no lesions  *** chaperoned for the exam.  A:  Well Woman with normal exam  P:

## 2019-09-07 ENCOUNTER — Encounter: Payer: Self-pay | Admitting: Obstetrics and Gynecology

## 2019-09-08 ENCOUNTER — Ambulatory Visit: Payer: BC Managed Care – PPO | Admitting: Obstetrics and Gynecology

## 2019-10-13 ENCOUNTER — Telehealth: Payer: Self-pay | Admitting: Obstetrics and Gynecology

## 2019-10-13 DIAGNOSIS — Z9189 Other specified personal risk factors, not elsewhere classified: Secondary | ICD-10-CM

## 2019-10-13 DIAGNOSIS — Z803 Family history of malignant neoplasm of breast: Secondary | ICD-10-CM

## 2019-10-13 DIAGNOSIS — Z1239 Encounter for other screening for malignant neoplasm of breast: Secondary | ICD-10-CM

## 2019-10-13 NOTE — Telephone Encounter (Signed)
Please let the patient know that I got a copy of her mammogram report. Her Rockne Menghini risk model is 29% for breast cancer. The recommendation would be to add yearly breast MRI's. I would recommend she do this in 6 months.

## 2019-10-14 NOTE — Telephone Encounter (Signed)
Left message to call Katherine Tout, RN at [facility] [phone].   

## 2019-10-19 NOTE — Telephone Encounter (Signed)
Patient returned a call to Jill.   

## 2019-10-20 NOTE — Telephone Encounter (Signed)
Spoke with patient, advised per Dr. Oscar La.  Patient agreeable to proceed with screening breast MRI, to be scheduled 04/2020.  Order placed, to be scheduled at [facility].  Patient is aware she will be contacted by [facility] directly to schedule, once scheduled our office will precert. Patient verbalizes understanding and is agreeable.  Routing to [employer].   Placed in [facility] hold.   Encounter closed.

## 2019-11-09 ENCOUNTER — Ambulatory Visit: Payer: BC Managed Care – PPO | Admitting: Sports Medicine

## 2019-11-09 ENCOUNTER — Other Ambulatory Visit: Payer: Self-pay

## 2019-11-09 VITALS — BP 152/90 | Ht 63.0 in | Wt 120.0 lb

## 2019-11-09 DIAGNOSIS — M79672 Pain in left foot: Secondary | ICD-10-CM | POA: Diagnosis not present

## 2019-11-09 DIAGNOSIS — Y9302 Activity, running: Secondary | ICD-10-CM | POA: Diagnosis not present

## 2019-11-09 NOTE — Progress Notes (Signed)
Office Visit Note   Patient: Michelle Ward           Date of Birth: Dec 04, 1970           MRN: [id number] Visit Date: 11/09/2019 Requested by: Devra Dopp, MD [address] [place],  [place] [address] PCP: Devra Dopp, MD  Subjective: CC: Left foot pain x3 weeks  HPI: Patient is a 49 year old female presenting to clinic today with concerns of pinpoint pain on her left foot.  She states that she is training for marathon, and a few weeks ago she ran 15 miles.  During the run, she noticed only a slight pain in the area of her medial midfoot-which she was easily able to run through.  The following morning, however, her pain had increased significantly accompanied by a "popping" in the area.  She took the next few days off from running, with subsequent resolution of the pain.  The following weekend she ran again with a similar mileage, and noticed a return to the pain.  She states the following morning the pain was so severe that she did not want at all-choosing instead to use her stationary bike.  Again, she rested her foot for several days and the pain resolved.  This past Saturday, she again attempted a long run.  During the run, she felt a sudden stab in her medial midfoot area, accompanied by a pop, and then complete resolution of the pain.  She was able to complete her run without any discomfort, and states she has not had any pain since then.  She is hoping to run the [place] marathon in November, but has a history of stress injuries in this foot and was worried that this may be a warning sign of the same.  Additionally, she has a history of overpronation, and was previously fitted with arch support insoles-however states that these were uncomfortable and she has not been able to tolerate them during her runs.              ROS:   All other systems were reviewed and are negative.  Objective: Vital Signs: BP (!) 152/90   Ht 5\' 3"  (1.6 m)   Wt 120 lb (54.4 kg)   BMI 21.26 kg/m   Physical  Exam:  General:  Alert and oriented, in no acute distress. Pulm:  Breathing unlabored. Psy:  Normal mood, congruent affect. Skin: No bruising or rashes on the left foot.  LEFT FOOT EXAM General assessment: Normal Gait.  Inspection: Very mild pes planus. Normal posterior tibialis function with feel raise.   No significant swelling or deformity.   Full range of motion throughout ankle and toes, with no discomfort . Palpation: Tenderness to palpation at the base of the first metatarsal, and medial cuneiform.  This tenderness does not extend to the navicular, nor does it extend more distally into the shaft of the first metatarsal. No Tenderness over the peroneal tendons. No bony tenderness to palpation over the 5th metatarsal, navicular, and normal Lisfranc joint without swelling or bruising.   Ligamentous Examination:  No Tenderness or ecchymosis over the ATFL, CFL, or PTFL No tenderness over the medial deltoid ligament complex Anterior drawer without anterior slide  Running form: Bilateral overpronation is appreciated while running.  Midfoot strike.  Overall, running form appears very good.  Imaging: Limited extremity ultrasound-left foot. Area of most significant tenderness associated with joint of first metatarsal and medial cuneiform.  Joint space mildly reduced in this area, with associated trace  effusion appreciated.  Shaft of first metatarsal with no cortical disruptions, or increased vascular flow. Medial cuneiform and navicular bone similarly have no cortical disruptions or increased flow to suggest stress reaction.  Impression: Mild degenerative changes appreciated at first metatarsal and medial cuneiform.  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  Examination with tenderness to palpation at the junction of the first metatarsal and the medial cuneiform, and ultrasound of this area does suggest mild degenerative  changes with associated effusion.  Patient overpronates when running, and suspect this is likely stressing this joint and causing the inflammation.  Reassuringly, no evidence of stress reaction in this area, or within the navicular. -Given poor tolerance of arch support insoles in the past, patient was provided with arch support strap to help offload the medial foot. -Instructed to purchase over-the-counter Voltaren gel to apply to this area to help improve her symptoms. -Encouraged to ice after her runs. -If pain worsens, or fails to improve with the above conservative measures patient is to return to clinic for reevaluation. -Patient no further questions or concerns at this time.   Patient seen and evaluated with the sports medicine fellow. I agree with the above plan of care. I was present for the ultrasound of the left foot. I agree with the interpretation. Treatment as above. Follow-up for ongoing or recalcitrant issues.

## 2019-11-09 NOTE — Patient Instructions (Signed)
Fortunately, we don't see any evidence of a stress reaction at this time, however you have some inflammation in the joint between the first metatarsal (the long bone leading to the big toe) and the mid-foot. This may be due to pronation when you're running adding extra stress to this area.  -Wear the arch brace when running to see if this can help prevent that pain by providing extra support to that joint.  -Try 'Voltaren Gel 1%'. This is available over the counter [facility] name is Diclofenac Gel). Apply directly to the location of your pain. It can be used every 4 hours if needed.  -Ice your foot after you run to see if this helps further reduce that inflammation.  -If you continue to have discomfort despite the above measures, please come back for further evaluation.

## 2020-03-07 ENCOUNTER — Other Ambulatory Visit: Payer: Self-pay

## 2020-03-07 ENCOUNTER — Ambulatory Visit: Payer: BC Managed Care – PPO | Admitting: Sports Medicine

## 2020-03-07 VITALS — BP 124/78 | Ht 63.0 in | Wt 115.0 lb

## 2020-03-07 DIAGNOSIS — M7522 Bicipital tendinitis, left shoulder: Secondary | ICD-10-CM

## 2020-03-07 DIAGNOSIS — S86001A Unspecified injury of right Achilles tendon, initial encounter: Secondary | ICD-10-CM

## 2020-03-07 MED ORDER — DICLOFENAC SODIUM 75 MG PO TBEC: DELAYED_RELEASE_TABLET | ORAL | 0 refills | Status: DC

## 2020-03-08 ENCOUNTER — Encounter: Payer: Self-pay | Admitting: Sports Medicine

## 2020-03-08 NOTE — Progress Notes (Signed)
   Subjective:    Patient ID: Michelle Ward, female    DOB: 08-Sep-1970, 50 y.o.   MRN: [id number]  HPI chief complaint: Right Achilles and left arm pain  Brityn presents today with a couple of different complaints.  She has returning right Achilles pain.  This is a chronic issue for her.  She recently ran a half marathon reinjuring this area.  She was last seen and treated in our office for this in 2019.  She was given a pair of green sports insoles and scaphoid pads but she stopped using them eventually.  She was also given some [place] heel drop exercises.  She admits that she still occasionally does those.  Her pain is not only in the distal Achilles but also in the heel.  She has been unable to run for 2 months due to her pain. She is also complaining of left arm pain which she localizes to the distal humerus and antecubital region.  She notices it most when carrying certain objects.  She denies any trauma.  She does like to participate in resistance training but again does not remember any specific injury that may have caused her pain.  Interim medical history reviewed Medications reviewed Allergies reviewed    Review of Systems As above    Objective:   Physical Exam  Well-developed, well-nourished.  No acute distress  Left arm: Patient has tenderness to palpation along the distal biceps tendon.  No swelling.  No ecchymosis.  Biceps tendon is intact with a positive hook test.  There is reproducible pain with resisted supination that she localizes to the antecubital fossa.  Ward strength.  Right heel: There is tenderness to palpation along the distal Achilles tendon but no significant swelling.  There is also tenderness to palpation at the insertion onto the calcaneus as well as around the medial and lateral calcaneus with calcaneal squeeze testing.  No swelling.  Ward strength.  No palpable defect.  Neurovascularly intact distally.  Walking with a slight limp.  Brief bedside ultrasound  of the right Achilles tendon does show some diffuse swelling throughout the distal Achilles tendon but no obvious tearing.      Assessment & Plan:   Left arm pain secondary to distal biceps tendinitis Chronic right Achilles pain-rule out calcaneal stress injury  For her left arm distal biceps tendinitis, she is placed on Voltaren 75 mg twice daily to take with food for the next 7 days.  She is cautioned about GI upset and also instructed not to take over-the-counter NSAIDs during this time.  I went through what exercises she needs to avoid in the gym.  Hopefully this will settle down on its own.  If not, we could consider physical therapy.  In regards to her right Achilles pain, this is a chronic issue for her that has failed conservative treatment in the past including physical therapy.  There is also concern about a possible calcaneal stress injury given the fact that she has been unable to return to running at all in the past 2 months.  Therefore I'm going to start with getting an x-ray of her ankle, including the calcaneus.  If unremarkable, we will need to proceed with an MRI specifically to rule out an occult stress injury or stress fracture to the calcaneus.  Phone follow-up with those results when available.  We'll delineate more definitive treatment based on her MRI findings.  Patient is in agreement with this plan.

## 2020-03-08 NOTE — Addendum Note (Signed)
Addended by: Rutha Bouchard E on: 03/08/2020 05:04 PM   Modules accepted: Orders

## 2020-03-17 ENCOUNTER — Ambulatory Visit
Admission: RE | Admit: 2020-03-17 | Discharge: 2020-03-17 | Disposition: A | Discharge status: Home / Self Care | Payer: BC Managed Care – PPO | Source: Ambulatory Visit | Attending: Sports Medicine | Admitting: Sports Medicine

## 2020-03-17 DIAGNOSIS — S86001A Unspecified injury of right Achilles tendon, initial encounter: Secondary | ICD-10-CM

## 2020-03-20 ENCOUNTER — Other Ambulatory Visit: Payer: Self-pay

## 2020-03-20 ENCOUNTER — Ambulatory Visit (INDEPENDENT_AMBULATORY_CARE_PROVIDER_SITE_OTHER): Payer: BC Managed Care – PPO | Admitting: Obstetrics and Gynecology

## 2020-03-20 ENCOUNTER — Encounter: Payer: Self-pay | Admitting: Obstetrics and Gynecology

## 2020-03-20 VITALS — BP 114/78 | HR 60 | Resp 16 | Ht 62.5 in | Wt 125.4 lb

## 2020-03-20 DIAGNOSIS — Z1211 Encounter for screening for malignant neoplasm of colon: Secondary | ICD-10-CM

## 2020-03-20 DIAGNOSIS — Z01419 Encounter for gynecological examination (general) (routine) without abnormal findings: Secondary | ICD-10-CM | POA: Diagnosis not present

## 2020-03-20 DIAGNOSIS — Z9189 Other specified personal risk factors, not elsewhere classified: Secondary | ICD-10-CM

## 2020-03-20 DIAGNOSIS — Z30431 Encounter for routine checking of intrauterine contraceptive device: Secondary | ICD-10-CM

## 2020-03-20 NOTE — Progress Notes (Signed)
50 y.o. G50P2002 Divorced White or Caucasian Not Hispanic or Latino female here for annual exam.  She has a mirena, placed in 10/17. Not sexually active currently. No vaginal bleeding.  She is having trouble falling asleep. Drinking a bottle of wine a night. Wants to stop drinking. Melatonin makes her jittery, doesn't tolerate tylenol pm. She is only getting about 5 hours a sleep a night.   She is on Wellbutrin and lexapro for depression.   H/O TVT and cystocele repair in 11/17.  No bowel or bladder c/o.     No LMP recorded. (Menstrual status: IUD).          Sexually active: No.  The current method of family planning is IUD.    Exercising: Yes.    [place]  Smoker:  No-- former smoker  Health Maintenance: Pap:  09-02-2018 negative, HR HPV negative History of abnormal Pap:  no MMG:  10-04-19 density d/birads 1 negative BMD:  Had done in her 30's Colonoscopy: 2012 internal hemorrhoids.  TDaP:  03-02-17 Gardasil: n/a   reports that she has quit smoking. She has never used smokeless tobacco. She reports current alcohol use of about 2.0 standard drinks of alcohol per week. She reports that she does not use drugs. Teaching 5 th grade. Daughter is 104, son is almost 10.    Past Medical History:  Diagnosis Date  . Amenorrhea   . Anemia   . Anxiety   . Celiac disease   . Depression   . Heart murmur   . History of anorexia nervosa   . Urinary incontinence     Past Surgical History:  Procedure Laterality Date  . BLADDER SUSPENSION N/A 12/18/2015   Procedure: TRANSVAGINAL TAPE (TVT) PROCEDURE exact midurethral sling;  Surgeon: Patton Salles, MD;  Location: [facility];  Service: Gynecology;  Laterality: N/A;  . CYSTOCELE REPAIR N/A 12/18/2015   Procedure: ANTERIOR REPAIR (CYSTOCELE);  Surgeon: Patton Salles, MD;  Location: [facility];  Service: Gynecology;  Laterality: N/A;  . CYSTOSCOPY N/A 12/18/2015   Procedure: CYSTOSCOPY;  Surgeon: Patton Salles, MD;  Location:  [facility];  Service: Gynecology;  Laterality: N/A;  . OVARIAN CYST DRAINAGE Right     Current Outpatient Medications  Medication Sig Dispense Refill  . buPROPion (WELLBUTRIN SR) 150 MG 12 hr tablet TAKE ONE TABLET (150 MG TOTAL) BY MOUTH 2 (TWO) TIMES DAILY.    Marland Kitchen escitalopram (LEXAPRO) 20 MG tablet TAKE 1 TABLET BY MOUTH EVERY DAY    . levonorgestrel (MIRENA) 20 MCG/24HR IUD by Intrauterine route.    . Multiple Vitamins-Minerals (MULTIVITAMIN PO) Take 1 tablet by mouth every morning.     No current facility-administered medications for this visit.    Family History  Problem Relation Age of Onset  . Breast cancer Mother   . Diabetes Father   . Hypertension Father   . Dementia Father   . Stroke Maternal Grandfather   . Lymphoma Paternal Grandmother   . Heart attack Paternal Grandfather     Review of Systems  All other systems reviewed and are negative.   Exam:   BP 114/78   Pulse 60   Resp 16   Ht 5' 2.5" (1.588 m)   Wt 125 lb 6.4 oz (56.9 kg)   BMI 22.57 kg/m   Weight change: @WEIGHTCHANGE @ Height:   Height: 5' 2.5" (158.8 cm)  Ht Readings from Last 3 Encounters:  03/20/20 5' 2.5" (1.588 m)  03/07/20 5\' 3"  (  1.6 m)  11/09/19 5\' 3"  (1.6 m)    General appearance: alert, cooperative and appears stated age Head: Normocephalic, without obvious abnormality, atraumatic Neck: no adenopathy, supple, symmetrical, trachea midline and thyroid normal to inspection and palpation Lungs: clear to auscultation bilaterally Cardiovascular: regular rate and rhythm Breasts: normal appearance, no masses or tenderness Abdomen: soft, non-tender; non distended,  no masses,  no organomegaly Extremities: extremities normal, atraumatic, no cyanosis or edema Skin: Skin color, texture, turgor normal. No rashes or lesions Lymph nodes: Cervical, supraclavicular, and axillary nodes normal. No abnormal inguinal nodes palpated Neurologic: Grossly normal   Pelvic: External genitalia:  no lesions               Urethra:  normal appearing urethra with no masses, tenderness or lesions              Bartholins and Skenes: normal                 Vagina: normal appearing vagina with normal color and discharge, no lesions              Cervix: no lesions, IUD string 3-4 cm               Bimanual Exam:  Uterus:  normal size, contour, position, consistency, mobility, non-tender              Adnexa: no mass, fullness, tenderness               Rectovaginal: Confirms               Anus:  normal sphincter tone, no lesions  chaperoned for the exam.  1. Well woman exam Discussed breast self exam Discussed calcium and vit D intake No pap this year Mammogram in 8/22  2. At high risk for breast cancer Breast MRI has been ordered  3. IUD check up Doing well.  Discussed ETOH use, she will f/u with her primary. She needs treatment for insomnia and may need adjustment in her antidepressants. Name of counselor given.

## 2020-03-20 NOTE — Patient Instructions (Addendum)
Counselor: Roosevelt Locks [phone]  Check with your insurance on coverage of cologuard and colonoscopy.   EXERCISE   We recommended that you start or continue a regular exercise program for good health. Physical activity is anything that gets your body moving, some is better than none. The CDC recommends 150 minutes per week of Moderate-Intensity Aerobic Activity and 2 or more days of Muscle Strengthening Activity.  Benefits of exercise are limitless: helps weight loss/weight maintenance, improves mood and energy, helps with depression and anxiety, improves sleep, tones and strengthens muscles, improves balance, improves bone density, protects from chronic conditions such as heart disease, high blood pressure and diabetes and so much more. To learn more visit: http://kirby-bean.org/  DIET: Good nutrition starts with a healthy diet of fruits, vegetables, whole grains, and lean protein sources. Drink plenty of water for hydration. Minimize empty calories, sodium, sweets. For more information about dietary recommendations visit: CriticalGas.be and https://www.carpenter-henry.info/  ALCOHOL:  Women should limit their alcohol intake to no more than 7 drinks/beers/glasses of wine (combined, not each!) per week. Moderation of alcohol intake to this level decreases your risk of breast cancer and liver damage.  If you are concerned that you may have a problem, or your friends have told you they are concerned about your drinking, there are many resources to help. A well-known program that is free, effective, and available to all people all over the nation is Alcoholics Anonymous.  Check out this site to learn more: BeverageBargains.co.za   CALCIUM AND VITAMIN D:  Adequate intake of calcium and Vitamin D are recommended for bone health.  You should be getting between 1000-1200 mg of calcium and 800 units of Vitamin D daily between diet  and supplements  PAP SMEARS:  Pap smears, to check for cervical cancer or precancers,  have traditionally been done yearly, scientific advances have shown that most women can have pap smears less often.  However, every woman still should have a physical exam from her gynecologist every year. It will include a breast check, inspection of the vulva and vagina to check for abnormal growths or skin changes, a visual exam of the cervix, and then an exam to evaluate the size and shape of the uterus and ovaries. We will also provide age appropriate advice regarding health maintenance, like when you should have certain vaccines, screening for sexually transmitted diseases, bone density testing, colonoscopy, mammograms, etc.   MAMMOGRAMS:  All women over 20 years old should have a routine mammogram.   COLON CANCER SCREENING: Now recommend starting at age 23. At this time colonoscopy is not covered for routine screening until 50. There are take home tests that can be done between 45-49.   COLONOSCOPY:  Colonoscopy to screen for colon cancer is recommended for all women at age 57.  We know, you hate the idea of the prep.  We agree, BUT, having colon cancer and not knowing it is worse!!  Colon cancer so often starts as a polyp that can be seen and removed at colonscopy, which can quite literally save your life!  And if your first colonoscopy is normal and you have no family history of colon cancer, most women don't have to have it again for 10 years.  Once every ten years, you can do something that may end up saving your life, right?  We will be happy to help you get it scheduled when you are ready.  Be sure to check your insurance coverage so you understand how much it will  cost.  It may be covered as a preventative service at no cost, but you should check your particular policy.      Breast Self-Awareness Breast self-awareness means being familiar with how your breasts look and feel. It involves checking your  breasts regularly and reporting any changes to your health care provider. Practicing breast self-awareness is important. A change in your breasts can be a sign of a serious medical problem. Being familiar with how your breasts look and feel allows you to find any problems early, when treatment is more likely to be successful. All women should practice breast self-awareness, including women who have had breast implants. How to do a breast self-exam One way to learn what is normal for your breasts and whether your breasts are changing is to do a breast self-exam. To do a breast self-exam: Look for Changes  1. Remove all the clothing above your waist. 2. Stand in front of a mirror in a room with good lighting. 3. Put your hands on your hips. 4. Push your hands firmly downward. 5. Compare your breasts in the mirror. Look for differences between them (asymmetry), such as: ? Differences in shape. ? Differences in size. ? Puckers, dips, and bumps in one breast and not the other. 6. Look at each breast for changes in your skin, such as: ? Redness. ? Scaly areas. 7. Look for changes in your nipples, such as: ? Discharge. ? Bleeding. ? Dimpling. ? Redness. ? A change in position. Feel for Changes Carefully feel your breasts for lumps and changes. It is best to do this while lying on your back on the floor and again while sitting or standing in the shower or tub with soapy water on your skin. Feel each breast in the following way:  Place the arm on the side of the breast you are examining above your head.  Feel your breast with the other hand.  Start in the nipple area and make  inch (2 cm) overlapping circles to feel your breast. Use the pads of your three middle fingers to do this. Apply light pressure, then medium pressure, then firm pressure. The light pressure will allow you to feel the tissue closest to the skin. The medium pressure will allow you to feel the tissue that is a little deeper.  The firm pressure will allow you to feel the tissue close to the ribs.  Continue the overlapping circles, moving downward over the breast until you feel your ribs below your breast.  Move one finger-width toward the center of the body. Continue to use the  inch (2 cm) overlapping circles to feel your breast as you move slowly up toward your collarbone.  Continue the up and down exam using all three pressures until you reach your armpit.  Write Down What You Find  Write down what is normal for each breast and any changes that you find. Keep a written record with breast changes or normal findings for each breast. By writing this information down, you do not need to depend only on memory for size, tenderness, or location. Write down where you are in your menstrual cycle, if you are still menstruating. If you are having trouble noticing differences in your breasts, do not get discouraged. With time you will become more familiar with the variations in your breasts and more comfortable with the exam. How often should I examine my breasts? Examine your breasts every month. If you are breastfeeding, the best time to examine your breasts  is after a feeding or after using a breast pump. If you menstruate, the best time to examine your breasts is 5-7 days after your period is over. During your period, your breasts are lumpier, and it may be more difficult to notice changes. When should I see my health care provider? See your health care provider if you notice:  A change in shape or size of your breasts or nipples.  A change in the skin of your breast or nipples, such as a reddened or scaly area.  Unusual discharge from your nipples.  A lump or thick area that was not there before.  Pain in your breasts.  Anything that concerns you.

## 2020-03-27 ENCOUNTER — Other Ambulatory Visit: Payer: Self-pay

## 2020-04-08 ENCOUNTER — Ambulatory Visit
Admission: RE | Admit: 2020-04-08 | Discharge: 2020-04-08 | Disposition: A | Discharge status: Home / Self Care | Payer: BC Managed Care – PPO | Source: Ambulatory Visit | Attending: Sports Medicine | Admitting: Sports Medicine

## 2020-04-08 ENCOUNTER — Other Ambulatory Visit: Payer: Self-pay

## 2020-04-08 DIAGNOSIS — S86001A Unspecified injury of right Achilles tendon, initial encounter: Secondary | ICD-10-CM

## 2020-04-17 ENCOUNTER — Telehealth: Payer: Self-pay | Admitting: Sports Medicine

## 2020-04-17 ENCOUNTER — Other Ambulatory Visit: Payer: Self-pay | Admitting: *Deleted

## 2020-04-17 DIAGNOSIS — S86001D Unspecified injury of right Achilles tendon, subsequent encounter: Secondary | ICD-10-CM

## 2020-04-17 NOTE — Telephone Encounter (Signed)
  I spoke with Michelle Ward on the phone today after reviewing the MRI of her right ankle done on February 27.  No evidence of calcaneal stress fracture or stress reaction is seen.  She does have some mild thickening of the plantar fascia and a small bone spur on her x-ray.  Achilles tendon appears normal.  She is still struggling with heel pain so I recommend formal physical therapy.  She is in agreement with this.  We will schedule this to be done at [facility] outpatient [facility] and she may wean to a home exercise program per the physical therapist's discretion.  I did explain to her that her x-ray and MRI are reassuring enough that she can continue with all activity as tolerated.

## 2020-04-18 ENCOUNTER — Ambulatory Visit
Admission: RE | Admit: 2020-04-18 | Discharge: 2020-04-18 | Disposition: A | Discharge status: Home / Self Care | Payer: BC Managed Care – PPO | Source: Ambulatory Visit | Attending: Obstetrics and Gynecology | Admitting: Obstetrics and Gynecology

## 2020-04-18 DIAGNOSIS — Z9189 Other specified personal risk factors, not elsewhere classified: Secondary | ICD-10-CM

## 2020-04-18 DIAGNOSIS — Z1239 Encounter for other screening for malignant neoplasm of breast: Secondary | ICD-10-CM

## 2020-04-18 DIAGNOSIS — Z803 Family history of malignant neoplasm of breast: Secondary | ICD-10-CM

## 2020-04-18 MED ORDER — GADOBUTROL 1 MMOL/ML IV SOLN
6.0000 mL | Freq: Once | INTRAVENOUS | Status: AC | PRN
Start: 2020-04-18 — End: 2020-04-18
  Administered 2020-04-18: 6 mL via INTRAVENOUS

## 2020-05-11 ENCOUNTER — Other Ambulatory Visit: Payer: Self-pay

## 2020-05-11 ENCOUNTER — Ambulatory Visit: Payer: BC Managed Care – PPO | Attending: Sports Medicine | Admitting: Physical Therapy

## 2020-05-11 ENCOUNTER — Encounter: Payer: Self-pay | Admitting: Physical Therapy

## 2020-05-11 DIAGNOSIS — M6281 Muscle weakness (generalized): Secondary | ICD-10-CM | POA: Insufficient documentation

## 2020-05-11 DIAGNOSIS — M25571 Pain in right ankle and joints of right foot: Secondary | ICD-10-CM | POA: Diagnosis not present

## 2020-05-11 DIAGNOSIS — M25551 Pain in right hip: Secondary | ICD-10-CM | POA: Insufficient documentation

## 2020-05-11 NOTE — Patient Instructions (Signed)
Access Code: [email or web address] URL: [email or web address] Date: 05/11/2020 Prepared by: Jeri Cos  Exercises  Seated Plantar Fascia Mobilization with Small Ball - 1 x daily - 7 x weekly - 3 sets - 10 reps Sidelying Hip Abduction - 1 x daily - 7 x weekly - 10 reps Standing Eccentric Heel Raise - 1 x daily - 7 x weekly - 2 sets - 10 reps Modified Thomas Stretch - 1 x daily - 7 x weekly - 3 sets - 30 seconds hold Supine Hamstring Stretch - 1 x daily - 7 x weekly - 3 sets - 10 reps

## 2020-05-11 NOTE — Therapy (Addendum)
[facility] [address] [place], [place], [address] Phone: [phone]   Fax:  [phone]  Physical Therapy Evaluation  Patient Details  Name: Michelle Ward MRN: [id number] Date of Birth: 1970/10/30 Referring Provider (PT): Ralene Cork, DO   Encounter Date: 05/11/2020   PT End of Session - 05/11/20 1548     Visit Number 1    Number of Visits 8    Date for PT Re-Evaluation 07/06/20    Authorization Type BCBS 2022    PT Start Time 1540   pt late   PT Stop Time 1619    PT Time Calculation (min) 39 min    Activity Tolerance Patient tolerated treatment well    Behavior During Therapy [facility] for tasks assessed/performed             Past Medical History:  Diagnosis Date   Amenorrhea    Anemia    Anxiety    Celiac disease    Depression    Heart murmur    History of anorexia nervosa    Urinary incontinence     Past Surgical History:  Procedure Laterality Date   BLADDER SUSPENSION N/A 12/18/2015   Procedure: TRANSVAGINAL TAPE (TVT) PROCEDURE exact midurethral sling;  Surgeon: Patton Salles, MD;  Location: [facility];  Service: Gynecology;  Laterality: N/A;   CYSTOCELE REPAIR N/A 12/18/2015   Procedure: ANTERIOR REPAIR (CYSTOCELE);  Surgeon: Patton Salles, MD;  Location: [facility];  Service: Gynecology;  Laterality: N/A;   CYSTOSCOPY N/A 12/18/2015   Procedure: CYSTOSCOPY;  Surgeon: Patton Salles, MD;  Location: [facility];  Service: Gynecology;  Laterality: N/A;   OVARIAN CYST DRAINAGE Right     There were no vitals filed for this visit.    Subjective Assessment - 05/11/20 1541     Subjective I ran a half marathon in November 2021 and started having really bad heel pain/achilles pain to the point that I had to stop running halfway through the marathon. Recently now I have started developing R hip problems on the outside and on my sit bone (points laterally and at ischial tuberosity). I have  always had achilles problems I think but the half marathon made it worse.    How long can you sit comfortably? none    How long can you stand comfortably? none    Patient Stated Goals get back to running    Currently in Pain? Yes    Pain Score 0-No pain   8/10 pain in marathon, 2/10 in heel, currently 0/10   Pain Location --   lateral hip, posterior hip, achilles, and posterior inferior heel   Pain Descriptors / Indicators Sharp;Dull    Pain Type Acute pain;Chronic pain    Pain Onset More than a month ago    Pain Frequency Intermittent    Aggravating Factors  running, wearing flats, first steps in the morning    Pain Relieving Factors moving, wearing heels    Effect of Pain on Daily Activities running                  [facility] PT Assessment - 05/11/20 0001       Assessment   Medical Diagnosis Injury of right Achilles tendon, subsequent encounter    Referring Provider (PT) Ralene Cork, DO    Onset Date/Surgical Date --   November 2021   Prior Therapy previously before coming hree for R achilles after November 2021  Precautions   Precautions Other (comment)    Precaution Comments no running      Restrictions   Weight Bearing Restrictions No      Balance Screen   Has the patient fallen in the past 6 months No    Has the patient had a decrease in activity level because of a fear of falling?  No    Is the patient reluctant to leave their home because of a fear of falling?  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    Leisure running      Cognition   Overall Cognitive Status Within Functional Limits for tasks assessed      Observation/Other Assessments   Observations pt in no apparent distress    Focus on Therapeutic Outcomes (FOTO)  70% functional status, predicted 81%      Sensation   Light Touch Impaired by gross assessment      Coordination   Gross Motor Movements are Fluid and Coordinated Yes       Functional Tests   Functional tests Single Leg Squat      Single Leg Squat   Comments x5 bilateral: R>L shaking, functional knee valgus, and decreased depth      ROM / Strength   AROM / PROM / Strength AROM;Strength      AROM   Overall AROM Comments R>L hip extension limitations in prone and sidelying noted grossly during hip extension and abduction MMT testing    AROM Assessment Site --    Right/Left Hip --    Right/Left Ankle --      Strength   Strength Assessment Site Ankle;Hip;Knee    Right/Left Hip Right;Left    Right Hip Flexion 4+/5    Right Hip Extension 4-/5    Right Hip ABduction 4/5    Left Hip Flexion 5/5    Left Hip Extension 5/5    Left Hip ABduction 5/5    Right/Left Knee Right;Left    Right Knee Flexion 4+/5    Right Knee Extension 5/5    Left Knee Flexion 5/5    Left Knee Extension 5/5    Right/Left Ankle Right;Left    Right Ankle Dorsiflexion 5/5    Right Ankle Plantar Flexion 5/5   able to perform 20 SL calf raises   Right Ankle Inversion 5/5    Right Ankle Eversion 5/5    Left Ankle Dorsiflexion 5/5    Left Ankle Plantar Flexion 5/5    Left Ankle Inversion 5/5    Left Ankle Eversion 5/5      Palpation   Palpation comment TTP at R hip TFL, glut med, achilles insertion and body of achilles, and on posteroinferior portion of calcaneal fat pad; no TTP on R plantar fascia with overpressure, but significant tension and 'ropeiness' noted      Special Tests   Other special tests R thomas test-negative for hip flexor, positive for quadriceps                                Objective measurements completed on examination: See above findings.          [facility] Adult PT Treatment/Exercise - 05/11/20 0001       Exercises   Exercises Knee/Hip;Ankle      Knee/Hip Exercises: Stretches   Active Hamstring Stretch Right;2 reps   10  reps   Active Hamstring Stretch Limitations 90 degrees at hip and extend at knee R leg    Hip Flexor Stretch  2 reps;30 seconds    Hip Flexor Stretch Limitations modified thomas stretch R      Knee/Hip Exercises: Standing   Heel Raises 2 sets;10 reps    Heel Raises Limitations concentric bilaterally, eccentric on 1 leg      Knee/Hip Exercises: Sidelying   Hip ABduction Right;10 reps    Hip ABduction Limitations starting second set caused pain, stuck to only 1 set      Ankle Exercises: Stretches   Plantar Fascia Stretch --   1 minute, R foot   Plantar Fascia Stretch Limitations with tennis ball rolling, standing                          PT Education - 05/11/20 1728     Education Details Exam findings, POC, HEP, sx explanation, involved muscles and how they could be related to her pain (glut med, hamstring, achilles)    Person(s) Educated Patient    Methods Explanation;Demonstration;Handout;Verbal cues    Comprehension Verbalized understanding;Returned demonstration;Need further instruction              PT Short Term Goals - 05/11/20 1754       PT SHORT TERM GOAL #1   Title Pt will be independent with initial HEP    Time 2    Period Weeks    Status New    Target Date 05/25/20      PT SHORT TERM GOAL #2   Title --                PT Long Term Goals - 05/11/20 1755       PT LONG TERM GOAL #1   Title Pt will be independent with advanced HEP    Time 8    Period Weeks    Status New    Target Date 07/06/20      PT LONG TERM GOAL #2   Title Pt will be able to run 1 mile with no increase in pain.    Baseline not running    Time 8    Period Weeks    Status New    Target Date 07/06/20      PT LONG TERM GOAL #3   Title Pt will demonstrate R LE MMT 5/5 with no increase in pain    Time 8    Period Weeks    Status New    Target Date 07/06/20      PT LONG TERM GOAL #4   Title Pt will report no TTP of the R achilles    Baseline TTP of the R achilles    Time 8    Period Weeks    Status New    Target Date 07/06/20      PT LONG TERM GOAL #5   Title Pt  will demonstrate R SL squat with no functional valgus and equal depth compared to the L    Time 8    Period Weeks    Status New    Target Date 07/06/20                         Plan - 05/11/20 1740     Clinical Impression Statement Pt is a 50 y/o F presenting to OPPT for an injury to the R achilles tendon that  happened back in November 2021 after running a half marathon. Pt also however presents with calcaneal pain, R lateral and posterior hip pain. Examination revealed TTP in the glut med/TFL/hamstring origin/achilles insertion, R hip abduction/extension/flexion and R knee flexion weakness, cording of the R plantar fascia, slight discomfort with SL claf raises, and functional valgus and decreased depth noted on R SL squat. Pt also showed possible hip flexor tightness in MMT positions, but had a negative Thomas stretch Pt was given exercises targeted towards the plantar fascia, eccentrically loading the R achilles, stretching the hip flexor, and strengthening the R glut med. Pt would benefit from skilled PT in order to help address the above impairements in order to help return to running.    Personal Factors and Comorbidities Time since onset of injury/illness/exacerbation;Comorbidity 3+    Comorbidities depression, history of anorexia nervosa, amenorrhea, anxiety, celiac disease    Examination-Activity Limitations Locomotion Level;Squat;Other;Bend;Lift   running   Examination-Participation Restrictions Yard Work;Occupation;Community Activity   sports   Stability/Clinical Decision Making Stable/Uncomplicated    Clinical Decision Making Low    Rehab Potential Good    PT Frequency 1x / week    PT Duration 6 weeks    PT Treatment/Interventions ADLs/Self Care Home Management;Moist Heat;Cryotherapy;Balance training;Therapeutic exercise;Therapeutic activities;Functional mobility training;Gait training;Patient/family education;Manual techniques;Dry needling;Passive range of motion;Joint  Manipulations;Taping;Iontophoresis 4mg /ml Dexamethasone;Ultrasound    PT Next Visit Plan assess AROM ankle, calf loading, glut med/hamstring/TFL strengthening, thomas stretch    PT Home Exercise Plan [id number]    Consulted and Agree with Plan of Care Patient             Patient will benefit from skilled therapeutic intervention in order to improve the following deficits and impairments:  Difficulty walking,Other (comment),Pain,Decreased strength,Decreased mobility,Decreased balance,Decreased activity tolerance,Improper body mechanics,Decreased range of motion,Increased muscle spasms (difficulty running)  Visit Diagnosis: Pain in right ankle and joints of right foot  Pain in right hip  Muscle weakness (generalized)      Problem List Patient Active Problem List   Diagnosis Date Noted   Insomnia 03/20/2016   Moderate episode of recurrent major depressive disorder (HCC) 03/20/2016   H/O osteopenia 12/13/2014   History of anorexia nervosa 12/13/2014   Celiac disease 07/13/2006    Michelle Ward, SPT 05/11/2020, 6:06 PM  [facility] [address] [place], [place], [address] Phone: [phone]   Fax:  [phone]  Name: Michelle Ward MRN: [id number] Date of Birth: 02/11/1971

## 2020-05-18 ENCOUNTER — Ambulatory Visit: Payer: BC Managed Care – PPO

## 2020-05-23 ENCOUNTER — Ambulatory Visit: Payer: BC Managed Care – PPO | Admitting: Physical Therapy

## 2020-06-06 ENCOUNTER — Ambulatory Visit: Payer: BC Managed Care – PPO | Attending: Sports Medicine | Admitting: Physical Therapy

## 2020-06-07 ENCOUNTER — Telehealth: Payer: Self-pay | Admitting: Physical Therapy

## 2020-06-07 NOTE — Telephone Encounter (Signed)
Attempted to contact patient due to missed PT appointment on 06/06/20. Left VM informing patient of missed appointment and reminded her of next scheduled appointment on 06/12/20 and that is her last appointment. Informed patient of attendance policy, this is her 1st missed appointment and if she misses any further appointment she would have remaining appointments cancelled and only be able to schedule 1 visit at a time.  Rosana Hoes, PT, DPT, LAT, ATC 06/07/20  11:24 AM Phone: [phone] Fax: [phone]

## 2020-06-12 ENCOUNTER — Ambulatory Visit: Payer: BC Managed Care – PPO | Attending: Sports Medicine | Admitting: Physical Therapy

## 2020-06-12 ENCOUNTER — Telehealth: Payer: Self-pay | Admitting: Physical Therapy

## 2020-06-12 NOTE — Telephone Encounter (Signed)
Attempted to contact patient due to missed PT appointment. Left VM informing patient of missed appointment and of attendance policy, this is her 2nd consecutive no show so she will only be able to schedule 1 visit at a time moving forward. This was the patients last scheduled visit so she was encouraged to contact the clinic at [phone] if she wanted to schedule another appointment.   Rosana Hoes, PT, DPT, LAT, ATC 06/12/20  4:11 PM Phone: [phone] Fax: [phone]

## 2020-08-31 ENCOUNTER — Encounter: Payer: Self-pay | Admitting: Internal Medicine

## 2020-09-05 ENCOUNTER — Other Ambulatory Visit: Payer: Self-pay

## 2020-09-05 ENCOUNTER — Ambulatory Visit (AMBULATORY_SURGERY_CENTER): Payer: BC Managed Care – PPO | Admitting: *Deleted

## 2020-09-05 VITALS — Ht 62.5 in | Wt 120.0 lb

## 2020-09-05 DIAGNOSIS — Z1211 Encounter for screening for malignant neoplasm of colon: Secondary | ICD-10-CM

## 2020-09-05 NOTE — Progress Notes (Signed)
Pt's previsit is done over the phone and all paperwork (prep instructions, blank consent form to just read over) sent to patient.  Pt's name and DOB verified at the beginning of the previsit.  Pt denies any difficulty with ambulating.     No trouble with anesthesia, denies being told they were difficult to intubate, or hx/fam hx of malignant hyperthermia per pt  Pt had ECHO is 2016- no results available, but she states "it was normal"   No egg or soy allergy  No home oxygen use   No medications for weight loss taken  emmi information given  Pt denies constipation issues

## 2020-09-18 ENCOUNTER — Encounter: Payer: Self-pay | Admitting: Internal Medicine

## 2020-09-19 ENCOUNTER — Other Ambulatory Visit: Payer: Self-pay

## 2020-09-19 ENCOUNTER — Encounter: Payer: Self-pay | Admitting: Internal Medicine

## 2020-09-19 ENCOUNTER — Ambulatory Visit (AMBULATORY_SURGERY_CENTER): Payer: BC Managed Care – PPO | Admitting: Internal Medicine

## 2020-09-19 VITALS — BP 115/71 | HR 48 | Temp 99.3°F | Resp 14 | Ht 62.0 in | Wt 120.0 lb

## 2020-09-19 DIAGNOSIS — Z1211 Encounter for screening for malignant neoplasm of colon: Secondary | ICD-10-CM

## 2020-09-19 MED ORDER — SODIUM CHLORIDE 0.9 % IV SOLN: 500.0000 mL | Freq: Once | INTRAVENOUS | Status: DC

## 2020-09-19 NOTE — Progress Notes (Signed)
A.S. vital signs. 

## 2020-09-19 NOTE — Progress Notes (Signed)
Vs nad pt transferred to pacu rn

## 2020-09-19 NOTE — Op Note (Signed)
[facility] Patient Name: Michelle Ward Procedure Date: 09/19/2020 2:15 PM MRN: [id number] Endoscopist: Iva Boop , MD Age: 50 Referring MD:  Date of Birth: 07-Nov-1970 Gender: Female Account #: [id number] Procedure:                Colonoscopy Indications:              Screening for colorectal malignant neoplasm, Last                            colonoscopy: 2012 Medicines:                Propofol per Anesthesia, Monitored Anesthesia Care Procedure:                Pre-Anesthesia Assessment:                           - Prior to the procedure, a History and Physical                            was performed, and patient medications and                            allergies were reviewed. The patient's tolerance of                            previous anesthesia was also reviewed. The risks                            and benefits of the procedure and the sedation                            options and risks were discussed with the patient.                            All questions were answered, and informed consent                            was obtained. Prior Anticoagulants: The patient has                            taken no previous anticoagulant or antiplatelet                            agents. ASA Grade Assessment: II - A patient with                            mild systemic disease. After reviewing the risks                            and benefits, the patient was deemed in                            satisfactory condition to undergo the procedure.  After obtaining informed consent, the colonoscope                            was passed under direct vision. Throughout the                            procedure, the patient's blood pressure, pulse, and                            oxygen saturations were monitored continuously. The                            Olympus PCF-[id number] (#[id number]) Colonoscope was                            introduced through the  anus and advanced to the the                            cecum, identified by appendiceal orifice and                            ileocecal valve. The colonoscopy was performed                            without difficulty. The patient tolerated the                            procedure well. The quality of the bowel                            preparation was good. The ileocecal valve,                            appendiceal orifice, and rectum were photographed.                            The bowel preparation used was Miralax via split                            dose instruction. Scope In: 2:34:00 PM Scope Out: 2:50:59 PM Scope Withdrawal Time: 0 hours 11 minutes 42 seconds  Total Procedure Duration: 0 hours 16 minutes 59 seconds  Findings:                 The perianal and digital rectal examinations were                            normal.                           The entire examined colon appeared normal on direct                            and retroflexion views. Complications:            No immediate complications. Estimated Blood  Loss:     Estimated blood loss: none. Impression:               - The entire examined colon is normal on direct and                            retroflexion views.                           - No specimens collected. Recommendation:           - Patient has a contact number available for                            emergencies. The signs and symptoms of potential                            delayed complications were discussed with the                            patient. Return to normal activities tomorrow.                            Written discharge instructions were provided to the                            patient.                           - Resume previous diet.                           - Continue present medications.                           - Repeat colonoscopy in 10 years for screening                            purposes. Iva Boop,  MD 09/19/2020 2:56:44 PM This report has been signed electronically.

## 2020-09-19 NOTE — Progress Notes (Signed)
Pt's states no medical or surgical changes since previsit or office visit. 

## 2020-09-19 NOTE — Patient Instructions (Addendum)
No polyps or cancer were seen.  Next routine colonoscopy or other screening test in 10 years - 2032.  I appreciate the opportunity to care for you. Iva Boop, MD, FACG   YOU HAD AN ENDOSCOPIC PROCEDURE TODAY AT THE [facility]:   Refer to the procedure report that was given to you for any specific questions about what was found during the examination.  If the procedure report does not answer your questions, please call your gastroenterologist to clarify.  If you requested that your care partner not be given the details of your procedure findings, then the procedure report has been included in a sealed envelope for you to review at your convenience later.  YOU SHOULD EXPECT: Some feelings of bloating in the abdomen. Passage of more gas than usual.  Walking can help get rid of the air that was put into your GI tract during the procedure and reduce the bloating. If you had a lower endoscopy (such as a colonoscopy or flexible sigmoidoscopy) you may notice spotting of blood in your stool or on the toilet paper. If you underwent a bowel prep for your procedure, you may not have a normal bowel movement for a few days.  Please Note:  You might notice some irritation and congestion in your nose or some drainage.  This is from the oxygen used during your procedure.  There is no need for concern and it should clear up in a day or so.  SYMPTOMS TO REPORT IMMEDIATELY:  Following lower endoscopy (colonoscopy or flexible sigmoidoscopy):  Excessive amounts of blood in the stool  Significant tenderness or worsening of abdominal pains  Swelling of the abdomen that is new, acute  Fever of 100F or higher   For urgent or emergent issues, a gastroenterologist can be reached at any hour by calling [phone]. Do not use MyChart messaging for urgent concerns.    DIET:  We do recommend a small meal at first, but then you may proceed to your regular diet.  Drink plenty of fluids but you  should avoid alcoholic beverages for 24 hours.  ACTIVITY:  You should plan to take it easy for the rest of today and you should NOT DRIVE or use heavy machinery until tomorrow (because of the sedation medicines used during the test).    FOLLOW UP: Our staff will call the number listed on your records 48-72 hours following your procedure to check on you and address any questions or concerns that you may have regarding the information given to you following your procedure. If we do not reach you, we will leave a message.  We will attempt to reach you two times.  During this call, we will ask if you have developed any symptoms of COVID 19. If you develop any symptoms (ie: fever, flu-like symptoms, shortness of breath, cough etc.) before then, please call [phone].  If you test positive for Covid 19 in the 2 weeks post procedure, please call and report this information to [place].    If any biopsies were taken you will be contacted by phone or by letter within the next 1-3 weeks.  Please call us at [phone] if you have not heard about the biopsies in 3 weeks.    SIGNATURES/CONFIDENTIALITY: You and/or your care partner have signed paperwork which will be entered into your electronic medical record.  These signatures attest to the fact that that the information above on your After Visit Summary has been reviewed and is understood.  Full responsibility of the confidentiality of this discharge information lies with you and/or your care-partner.  

## 2020-09-19 NOTE — Progress Notes (Signed)
[facility] History and Physical   Primary Care Physician:  Devra Dopp, MD   Reason for Procedure:  screening  Plan:    colonoscopy     HPI: AMAIRANI SHUEY is a 50 y.o. female here for screening colonoscopy   Past Medical History:  Diagnosis Date   Amenorrhea    Anemia    Anxiety    Celiac disease    Depression    Heart murmur    History of anorexia nervosa    Urinary incontinence     Past Surgical History:  Procedure Laterality Date   BLADDER SUSPENSION N/A 12/18/2015   Procedure: TRANSVAGINAL TAPE (TVT) PROCEDURE exact midurethral sling;  Surgeon: Patton Salles, MD;  Location: [facility];  Service: Gynecology;  Laterality: N/A;   COLONOSCOPY     greater than 10 years ago in [place] "normal" per pt   CYSTOCELE REPAIR N/A 12/18/2015   Procedure: ANTERIOR REPAIR (CYSTOCELE);  Surgeon: Patton Salles, MD;  Location: [facility];  Service: Gynecology;  Laterality: N/A;   CYSTOSCOPY N/A 12/18/2015   Procedure: CYSTOSCOPY;  Surgeon: Patton Salles, MD;  Location: [facility];  Service: Gynecology;  Laterality: N/A;   OVARIAN CYST DRAINAGE Right     Prior to Admission medications   Medication Sig Start Date End Date Taking? Authorizing Provider  buPROPion (WELLBUTRIN SR) 150 MG 12 hr tablet TAKE ONE TABLET (150 MG TOTAL) BY MOUTH 2 (TWO) TIMES DAILY. 04/28/15  Yes [provider]  escitalopram (LEXAPRO) 20 MG tablet TAKE 1 TABLET BY MOUTH EVERY DAY 07/29/15  Yes [provider]  levonorgestrel (MIRENA) 20 MCG/24HR IUD by Intrauterine route.   Yes [provider]  Multiple Vitamins-Minerals (MULTIVITAMIN PO) Take 1 tablet by mouth every morning.   Yes [provider]    Current Outpatient Medications  Medication Sig Dispense Refill   buPROPion (WELLBUTRIN SR) 150 MG 12 hr tablet TAKE ONE TABLET (150 MG TOTAL) BY MOUTH 2 (TWO) TIMES DAILY.     escitalopram (LEXAPRO) 20 MG tablet TAKE 1 TABLET BY MOUTH  EVERY DAY     levonorgestrel (MIRENA) 20 MCG/24HR IUD by Intrauterine route.     Multiple Vitamins-Minerals (MULTIVITAMIN PO) Take 1 tablet by mouth every morning.     Current Facility-Administered Medications  Medication Dose Route Frequency Provider Last Rate Last Admin   0.9 %  sodium chloride infusion  500 mL Intravenous Once Iva Boop, MD        Allergies as of 09/19/2020   (No Known Allergies)    Family History  Problem Relation Age of Onset   Breast cancer Mother    Diabetes Father    Hypertension Father    Dementia Father    Stroke Maternal Grandfather    Lymphoma Paternal Grandmother    Heart attack Paternal Grandfather    Colon cancer Neg Hx    Esophageal cancer Neg Hx    Stomach cancer Neg Hx    Rectal cancer Neg Hx     Social History   Socioeconomic History   Marital status: Divorced    Spouse name: Not on file   Number of children: Not on file   Years of education: Not on file   Highest education level: Not on file  Occupational History   Not on file  Tobacco Use   Smoking status: Former   Smokeless tobacco: Never  Vaping Use   Vaping Use: Never used  Substance and Sexual Activity  Alcohol use: Yes    Alcohol/week: 2.0 standard drinks    Types: 2 Glasses of wine per week   Drug use: No   Sexual activity: Not Currently    Partners: Male    Birth control/protection: I.U.D.    Comment: Mirena inserted 11-28-15  Other Topics Concern   Not on file  Social History Narrative   Not on file   Social Determinants of Health   Financial Resource Strain: Not on file  Food Insecurity: Not on file  Transportation Needs: Not on file  Physical Activity: Not on file  Stress: Not on file  Social Connections: Not on file  Intimate Partner Violence: Not on file    Review of Systems:  All other review of systems negative except as mentioned in the HPI.  Physical Exam: BP 125/85   Pulse (!) 56   Temp 99.3 F (37.4 C)   Ht 5\' 2"  (1.575 m)   Wt  120 lb (54.4 kg)   SpO2 99%   BMI 21.95 kg/m      General:   Alert,  Well-developed, well-nourished, pleasant and cooperative in NAD Lungs:  Clear throughout to auscultation.   Heart:  Regular rate and rhythm; no murmurs, clicks, rubs,  or gallops. Abdomen:  Soft, nontender and nondistended. Normal bowel sounds.   Neuro/Psych:  Alert and cooperative. Normal mood and affect. A and O x 3   @Gae Bihl  , MD, [facility] [phone] (pager) 09/19/2020 2:31 PM@

## 2020-09-21 ENCOUNTER — Telehealth: Payer: Self-pay

## 2020-09-21 NOTE — Telephone Encounter (Signed)
  Follow up Call-  Call back number 09/19/2020  Post procedure Call Back phone  # [phone]  Permission to leave phone message Yes  Some recent data might be hidden     Patient questions:  Do you have a fever, pain , or abdominal swelling? No. Pain Score  0 *  Have you tolerated food without any problems? Yes.    Have you been able to return to your normal activities? Yes.    Do you have any questions about your discharge instructions: Diet   No. Medications  No. Follow up visit  No.  Do you have questions or concerns about your Care? No.  Actions: * If pain score is 4 or above: No action needed, pain <4.  Have you developed a fever since your procedure? no  2.   Have you had an respiratory symptoms (SOB or cough) since your procedure? no  3.   Have you tested positive for COVID 19 since your procedure no  4.   Have you had any family members/close contacts diagnosed with the COVID 19 since your procedure?  no   If yes to any of these questions please route to Laverna Peace, RN and Karlton Lemon, RN

## 2021-04-26 NOTE — Progress Notes (Signed)
51 y.o. G48P2002 Divorced White or Caucasian Not Hispanic or Latino female here for annual exam.  She has a mirena IUD, placed in 10/17. No bleeding. Not currently sexually active.  ?No vasomotor c/o.  ?  ?She is on Wellbutrin and lexapro for depression. Doing well. ? ?Sleep is still a problem. Doing much better with ETOH intake, mainly drinking on the weekends. ~8 drinks a week.  ?  ?H/O TVT and cystocele repair in 11/17. ? ?Elevated risk of breast cancer, TC risk 29% ? ?She c/o a 3-4 week h/o right breast pain.  ? ?No LMP recorded. (Menstrual status: IUD).          ?Sexually active: No.  ?The current method of family planning is IUD.    ?Exercising: Yes.     cardio ?Smoker:  no ? ?Health Maintenance: ?Pap:   09-02-2018 negative, HR HPV negative ?History of abnormal Pap:  no ?Breast MR:  04/20/20 Bi-rads 1 neg  ?10/04/19: Bi-rads 1, negative, category D ?She went for a mammogram last week, because of pain they are having her come back for a diagnostic mammogram.  ?BMD:   had one done in her 30's per patient  ?Colonoscopy: 09/19/20 f/u 10 years  ?TDaP:  03/02/17  ?Gardasil: n/a ? ? reports that she has quit smoking. She has never used smokeless tobacco. She reports current alcohol use of about 2.0 standard drinks per week. She reports that she does not use drugs. Teaching 4 th grade, now in private school and so much happier. Daughter is 71, son is almost 48.  She has 50/50 custody.  ? ?Past Medical History:  ?Diagnosis Date  ? Amenorrhea   ? Anemia   ? Anxiety   ? Celiac disease   ? Depression   ? Heart murmur   ? History of anorexia nervosa   ? Urinary incontinence   ? ? ?Past Surgical History:  ?Procedure Laterality Date  ? BLADDER SUSPENSION N/A 12/18/2015  ? Procedure: TRANSVAGINAL TAPE (TVT) PROCEDURE exact midurethral sling;  Surgeon: Nunzio Cobbs, MD;  Location: [facility];  Service: Gynecology;  Laterality: N/A;  ? COLONOSCOPY    ? greater than 10 years ago in [place] "normal" per pt  ? CYSTOCELE REPAIR  N/A 12/18/2015  ? Procedure: ANTERIOR REPAIR (CYSTOCELE);  Surgeon: Nunzio Cobbs, MD;  Location: [facility];  Service: Gynecology;  Laterality: N/A;  ? CYSTOSCOPY N/A 12/18/2015  ? Procedure: CYSTOSCOPY;  Surgeon: Nunzio Cobbs, MD;  Location: [facility];  Service: Gynecology;  Laterality: N/A;  ? OVARIAN CYST DRAINAGE Right   ? ? ?Current Outpatient Medications  ?Medication Sig Dispense Refill  ? buPROPion (WELLBUTRIN SR) 150 MG 12 hr tablet TAKE ONE TABLET (150 MG TOTAL) BY MOUTH 2 (TWO) TIMES DAILY.    ? escitalopram (LEXAPRO) 20 MG tablet TAKE 1 TABLET BY MOUTH EVERY DAY    ? levonorgestrel (MIRENA) 20 MCG/24HR IUD by Intrauterine route.    ? Multiple Vitamins-Minerals (MULTIVITAMIN PO) Take 1 tablet by mouth every morning.    ? ?No current facility-administered medications for this visit.  ? ? ?Family History  ?Problem Relation Age of Onset  ? Breast cancer Mother   ? Diabetes Father   ? Hypertension Father   ? Dementia Father   ? Stroke Maternal Grandfather   ? Lymphoma Paternal Grandmother   ? Heart attack Paternal Grandfather   ? Colon cancer Neg Hx   ? Esophageal cancer Neg Hx   ?  Stomach cancer Neg Hx   ? Rectal cancer Neg Hx   ? ? ?Review of Systems  ?All other systems reviewed and are negative. ? ?Exam:   ?BP 126/74   Pulse (!) 52   Ht 5\' 3"  (1.6 m)   Wt 125 lb (56.7 kg)   SpO2 99%   BMI 22.14 kg/m?   Weight change: @WEIGHTCHANGE @ Height:   Height: 5\' 3"  (160 cm)  ?Ht Readings from Last 3 Encounters:  ?05/07/21 5\' 3"  (1.6 m)  ?09/19/20 5\' 2"  (1.575 m)  ?09/05/20 5' 2.5" (1.588 m)  ? ? ?General appearance: alert, cooperative and appears stated age ?Head: Normocephalic, without obvious abnormality, atraumatic ?Neck: no adenopathy, supple, symmetrical, trachea midline and thyroid normal to inspection and palpation ?Lungs: clear to auscultation bilaterally ?Cardiovascular: regular rate and rhythm ?Breasts:  slight increase in nodularity in the right breast at 12 o'clock at the areolar  region and at 9 o'clock a few cm from the areolar region, both areas are tender. No other lumps, no skin changes.  ?Abdomen: soft, non-tender; non distended,  no masses,  no organomegaly ?Extremities: extremities normal, atraumatic, no cyanosis or edema ?Skin: Skin color, texture, turgor normal. No rashes or lesions ?Lymph nodes: Cervical, supraclavicular, and axillary nodes normal. ?No abnormal inguinal nodes palpated ?Neurologic: Grossly normal ? ? ?Pelvic: External genitalia:  no lesions ?             Urethra:  normal appearing urethra with no masses, tenderness or lesions ?             Bartholins and Skenes: normal    ?             Vagina: normal appearing vagina with normal color and discharge, no lesions ?             Cervix: no lesions and IUD string 3 cm ?              ?Bimanual Exam:  Uterus:  normal size, contour, position, consistency, mobility, non-tender ?             Adnexa: no mass, fullness, tenderness ?              Rectovaginal: Confirms ?              Anus:  normal sphincter tone, no lesions ? ?Gae Dry chaperoned for the exam. ? ? ?1. Well woman exam ?Discussed breast self exam ?Discussed calcium and vit D intake ?No pap this year ?Colonoscopy UTD ?Labs with primary ? ?2. At high risk for breast cancer ?C/o tenderness. She has some nodularity on exam ?She has diagnostic breast imaging scheduled next week at [facility] ?We discussed MRI, if her mammogram is normal, will likely set this up in 6 months ?We discussed contrast deposits from the MRI and unknown long term effects.  ? ?3. IUD check up ?Doing well ? ? ?

## 2021-05-07 ENCOUNTER — Ambulatory Visit (INDEPENDENT_AMBULATORY_CARE_PROVIDER_SITE_OTHER): Payer: Commercial Managed Care - PPO | Admitting: Obstetrics and Gynecology

## 2021-05-07 ENCOUNTER — Encounter: Payer: Self-pay | Admitting: Obstetrics and Gynecology

## 2021-05-07 ENCOUNTER — Other Ambulatory Visit: Payer: Self-pay

## 2021-05-07 VITALS — BP 126/74 | HR 52 | Ht 63.0 in | Wt 125.0 lb

## 2021-05-07 DIAGNOSIS — Z01419 Encounter for gynecological examination (general) (routine) without abnormal findings: Secondary | ICD-10-CM

## 2021-05-07 DIAGNOSIS — Z30431 Encounter for routine checking of intrauterine contraceptive device: Secondary | ICD-10-CM | POA: Diagnosis not present

## 2021-05-07 DIAGNOSIS — Z9189 Other specified personal risk factors, not elsewhere classified: Secondary | ICD-10-CM | POA: Diagnosis not present

## 2021-05-07 NOTE — Patient Instructions (Signed)

## 2021-05-09 ENCOUNTER — Encounter: Payer: Self-pay | Admitting: Obstetrics and Gynecology

## 2021-06-05 ENCOUNTER — Encounter: Payer: Self-pay | Admitting: Obstetrics and Gynecology

## 2021-07-19 IMAGING — MR MR BREAST BILAT WO/W CM
8 of 12 series · 33 of 48 positions shown · IV contrast (gadavist)
Comparison: Multiple previous mammograms.

CLINICAL DATA: High risk patient with a lifetime risk of breast
cancer calculated at 29%. History of breast cancer in the patient's
mother.

LABS:  None
EXAM:
BILATERAL BREAST MRI WITH AND WITHOUT CONTRAST
TECHNIQUE: Multiplanar, multisequence MR images of both breasts were obtained
prior to and following the intravenous administration of 6 ml of
Gadavist

[Series 2: t2_tirm_tra ipat (a-p) · axial · 3.0mm · 0.70mm/px · 1 of 58 slices shown]
[im 1/58]
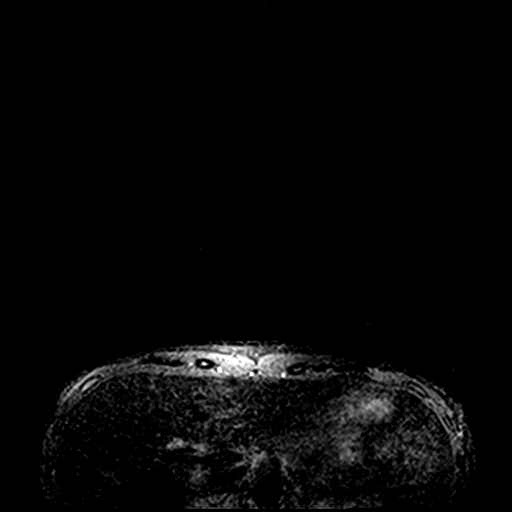

[Series 3: fl3d pre-cm no · axial · non-contrast · 1.2mm · 0.94mm/px · z∈[-95,+77]mm · 5 of 144 slices shown]
[im 1/144]
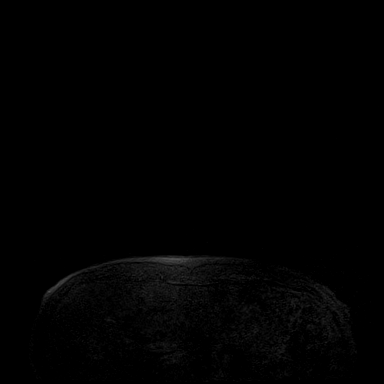
[im 36/144]
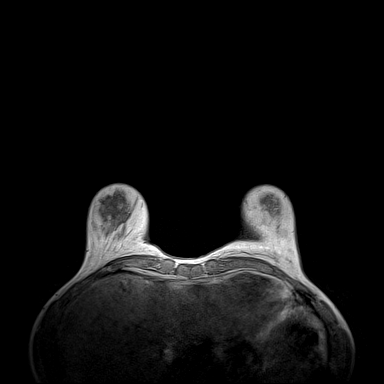
[im 72/144]
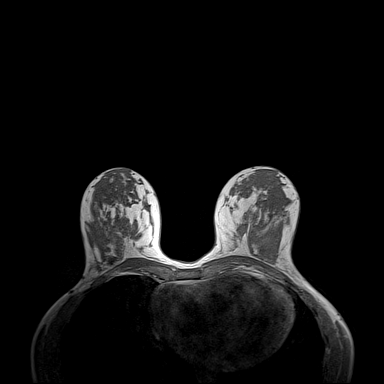
[im 108/144]
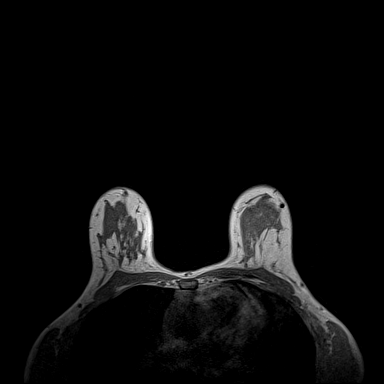
[im 144/144]
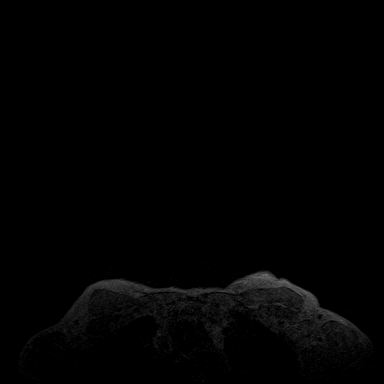

[Series 6: fl3d pre-cm · axial · non-contrast · 1.2mm · 0.94mm/px · z∈[-95,+77]mm · 5 of 144 slices shown]
[im 1/144]
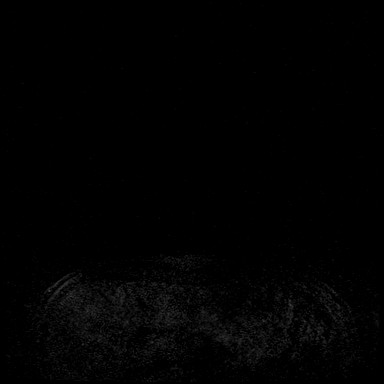
[im 36/144]
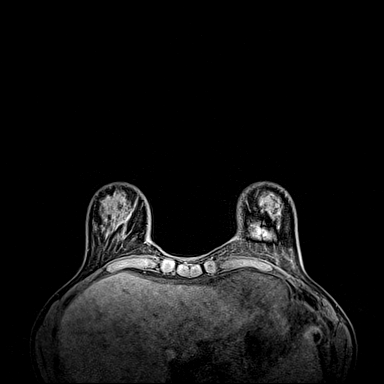
[im 72/144]
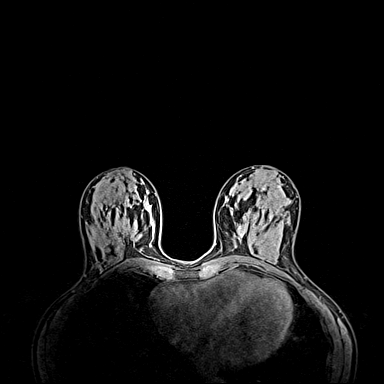
[im 108/144]
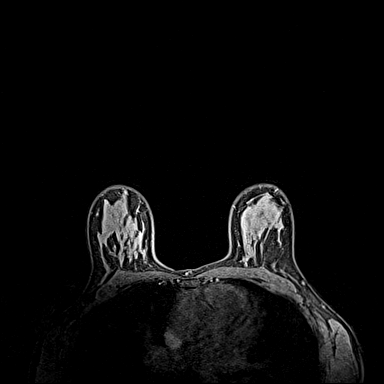
[im 144/144]
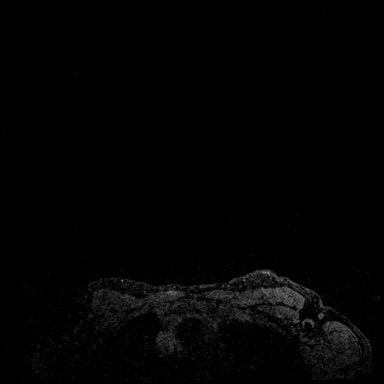

[Series 7: fl3d post immediate · axial · 1.2mm · 0.94mm/px · z∈[-95,+77]mm · 5 of 144 slices shown (1 of 3)]
[im 1/144]
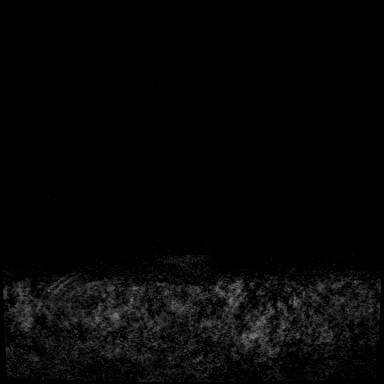
[im 36/144]
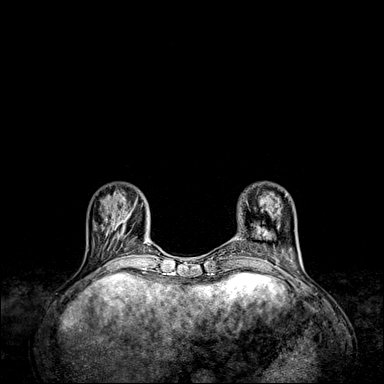
[im 72/144]
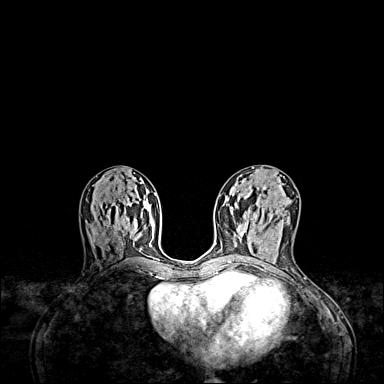
[im 108/144]
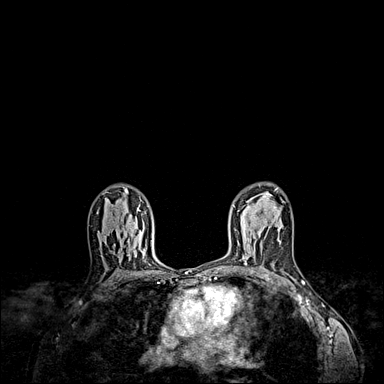
[im 144/144]
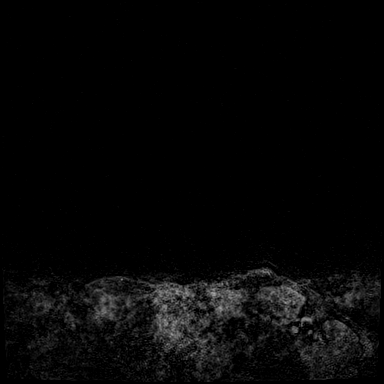

[Series 8: fl3d post immediate · axial · 1.2mm · 0.94mm/px · z∈[-95,+77]mm · 5 of 144 slices shown (2 of 3)]
[im 1/144]
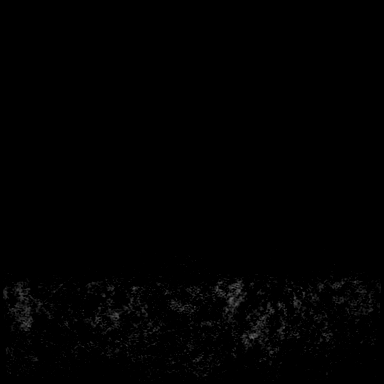
[im 36/144]
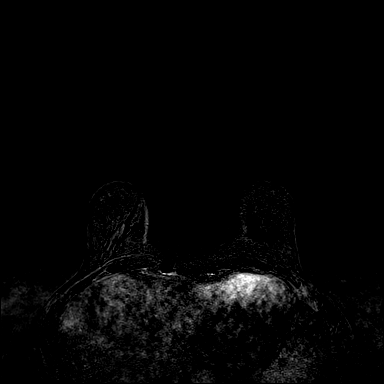
[im 72/144]
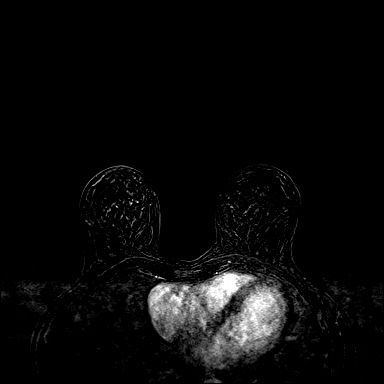
[im 108/144]
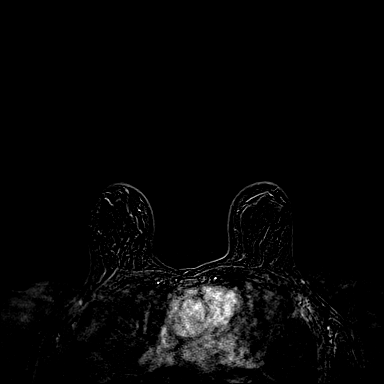
[im 144/144]
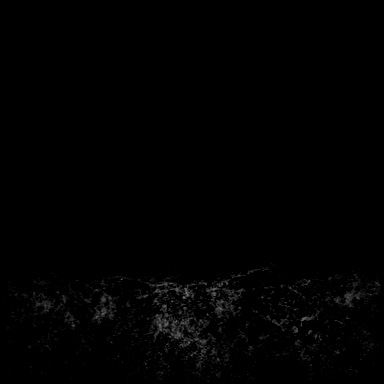

[Series 9: fl3d post immediate · axial · 172.8mm · 0.94mm/px · 1 of 1 slices shown (3 of 3)]
[im 1/1]
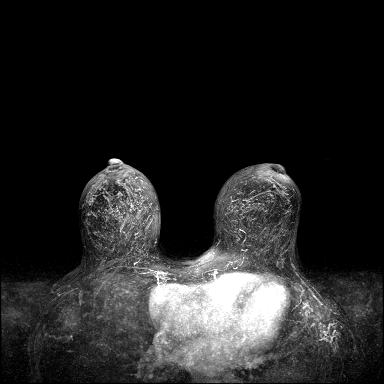

[Series 10: fl3d post 3min · axial · 1.2mm · 0.94mm/px · z∈[-95,+77]mm · 6 of 144 slices shown]
[im 1/144]
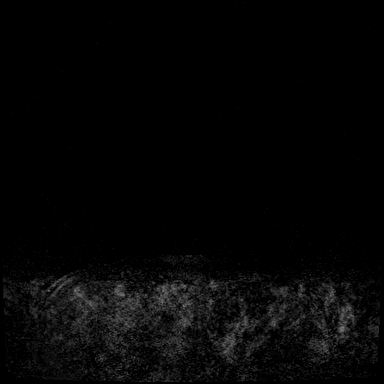
[im 29/144]
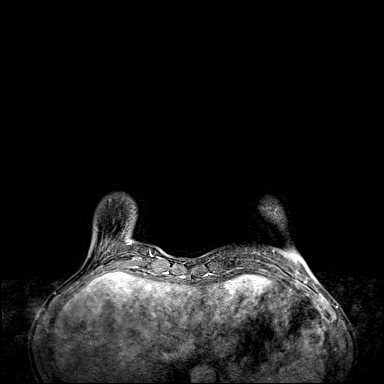
[im 58/144]
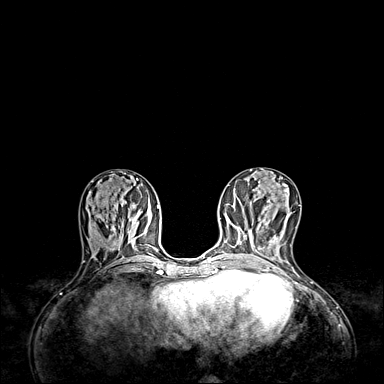
[im 86/144]
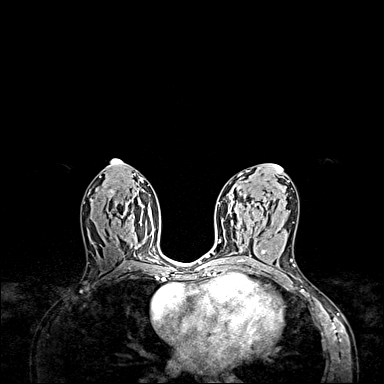
[im 115/144]
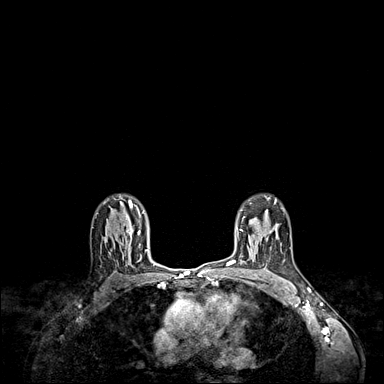
[im 144/144]
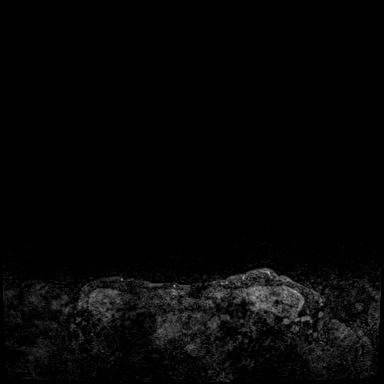

[Series 11: fl3d post 3min_sub · axial · 1.2mm · 0.94mm/px · z∈[-95,+42]mm · 5 of 144 slices shown]
[im 1/144]
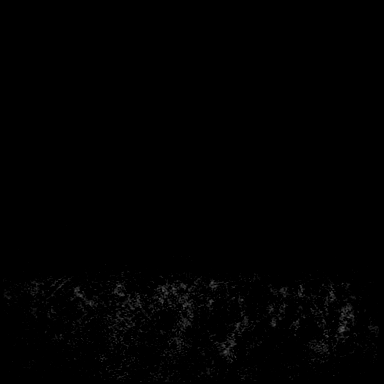
[im 29/144]
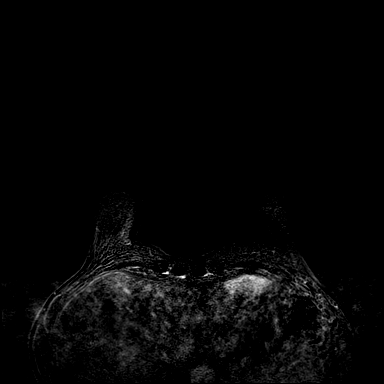
[im 58/144]
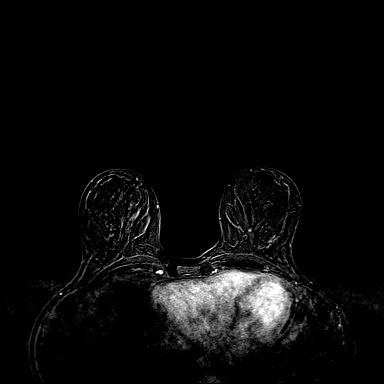
[im 86/144]
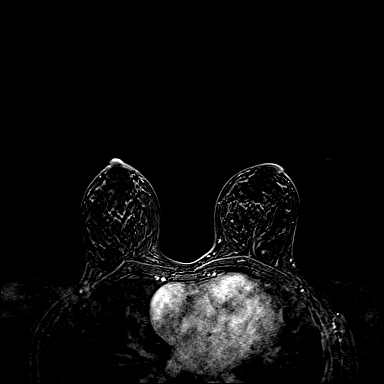
[im 115/144]
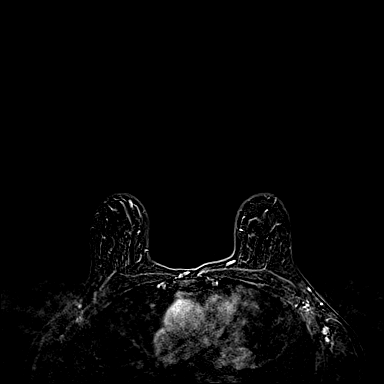

[33 of 48 positions shown; findings below may reference images not displayed]

Three-dimensional MR images were rendered by post-processing of the
original MR data on an independent workstation. The
three-dimensional MR images were interpreted, and findings are
reported in the following complete MRI report for this study. Three
dimensional images were evaluated at the independent interpreting
workstation using the DynaCAD thin client.
FINDINGS: Breast composition: d. Extreme fibroglandular tissue.

Background parenchymal enhancement: Moderate.

Right breast: No mass or abnormal enhancement.

Left breast: No mass or abnormal enhancement.

Lymph nodes: No abnormal appearing lymph nodes.

Ancillary findings: There is a T2 bright T1 dark nonenhancing lesion
in the right hepatic lobe consistent with a. No other suspicious
IMPRESSION: No MRI evidence of malignancy.

RECOMMENDATION:
Recommend annual screening mammography and breast given the
patient's high risk status

BI-RADS CATEGORY  1: Negative.

## 2021-10-23 ENCOUNTER — Ambulatory Visit: Payer: Commercial Managed Care - PPO | Admitting: Obstetrics and Gynecology

## 2021-12-03 ENCOUNTER — Ambulatory Visit: Payer: Commercial Managed Care - PPO | Admitting: Obstetrics and Gynecology

## 2022-05-14 ENCOUNTER — Encounter: Payer: Self-pay | Admitting: Obstetrics and Gynecology

## 2022-05-22 ENCOUNTER — Encounter: Payer: Self-pay | Admitting: Obstetrics and Gynecology
